# Patient Record
Sex: Male | Born: 1990 | Race: Black or African American | Hispanic: No | Marital: Single | Smoking: Current every day smoker
Health system: Southern US, Community
[De-identification: ages and names within clinical notes are randomized; demographics above are authoritative.]

---
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
## 2010-12-29 ENCOUNTER — Inpatient Hospital Stay (INDEPENDENT_AMBULATORY_CARE_PROVIDER_SITE_OTHER)
Admission: RE | Admit: 2010-12-29 | Discharge: 2010-12-29 | Disposition: A | Discharge status: Home / Self Care | Payer: Self-pay | Source: Ambulatory Visit | Attending: Family Medicine | Admitting: Family Medicine

## 2010-12-29 DIAGNOSIS — B9789 Other viral agents as the cause of diseases classified elsewhere: Secondary | ICD-10-CM

## 2010-12-29 DIAGNOSIS — K5289 Other specified noninfective gastroenteritis and colitis: Secondary | ICD-10-CM

## 2012-09-11 ENCOUNTER — Emergency Department (INDEPENDENT_AMBULATORY_CARE_PROVIDER_SITE_OTHER)
Admission: EM | Admit: 2012-09-11 | Discharge: 2012-09-11 | Disposition: A | Discharge status: Home / Self Care | Payer: Self-pay | Source: Home / Self Care | Attending: Emergency Medicine | Admitting: Emergency Medicine

## 2012-09-11 ENCOUNTER — Encounter (HOSPITAL_COMMUNITY): Payer: Self-pay | Admitting: Emergency Medicine

## 2012-09-11 DIAGNOSIS — K529 Noninfective gastroenteritis and colitis, unspecified: Secondary | ICD-10-CM

## 2012-09-11 DIAGNOSIS — K5289 Other specified noninfective gastroenteritis and colitis: Secondary | ICD-10-CM

## 2012-09-11 MED ORDER — DIPHENOXYLATE-ATROPINE 2.5-0.025 MG PO TABS: 1.0000 | ORAL_TABLET | Freq: Four times a day (QID) | ORAL | Status: DC | PRN

## 2012-09-11 MED ORDER — ONDANSETRON 8 MG PO TBDP: 8.0000 mg | ORAL_TABLET | Freq: Three times a day (TID) | ORAL | Status: DC | PRN

## 2012-09-11 NOTE — ED Provider Notes (Signed)
Chief Complaint  Patient presents with  . Nausea  . Diarrhea  . Emesis    History of Present Illness:   The patient is a 21 year old male who has had a five-day history of nausea, vomiting, and diarrhea. This came on after eating some [place] food. Also his sister was sick with a similar illness. He denies any associated fever or chills. He has had mild periumbilical abdominal pain. There's been no hematemesis, coffee-ground emesis, or bilious emesis. No blood in the stool. He states his symptoms have just about completely cleared up today and is just here to get a note to get back to work.  Review of Systems:  Other than noted above, the patient denies any of the following symptoms: Systemic:  No fevers, chills, sweats, weight loss or gain, fatigue, or tiredness. ENT:  No nasal congestion, rhinorrhea, or sore throat. Lungs:  No cough, wheezing, or shortness of breath. Cardiac:  No chest pain, syncope, or presyncope. GI:  No abdominal pain, nausea, vomiting, anorexia, diarrhea, constipation, blood in stool or vomitus. GU:  No dysuria, frequency, or urgency.  PMFSH:  Past medical history, family history, social history, meds, and allergies were reviewed.  Physical Exam:   Vital signs:  BP 114/72  Pulse 64  Temp 98.7 F (37.1 C) (Oral)  Resp 18  SpO2 98% General:  Alert and oriented.  In no distress.  Skin warm and dry.  Good skin turgor, brisk capillary refill. ENT:  No scleral icterus, moist mucous membranes, no oral lesions, pharynx clear. Lungs:  Breath sounds clear and equal bilaterally.  No wheezes, rales, or rhonchi. Heart:  Rhythm regular, without extrasystoles.  No gallops or murmers. Abdomen:  He has mild tenderness to palpation in the periumbilical area but no guarding or rebound. There is no organomegaly or mass. Bowel sounds are normally active. Skin: Clear, warm, and dry.  Good turgor.  Brisk capillary refill.  Assessment:  The encounter diagnosis was  Gastroenteritis.  Plan:   1.  The following meds were prescribed:   New Prescriptions   DIPHENOXYLATE-ATROPINE (LOMOTIL) 2.5-0.025 MG PER TABLET    Take 1 tablet by mouth 4 (four) times daily as needed for diarrhea or loose stools.   ONDANSETRON (ZOFRAN ODT) 8 MG DISINTEGRATING TABLET    Take 1 tablet (8 mg total) by mouth every 8 (eight) hours as needed for nausea.   2.  The patient was instructed in symptomatic care and handouts were given. 3.  The patient was told to return if becoming worse in any way, if no better in 2 or 3 days, and given some red flag symptoms that would indicate earlier return. 4.  The patient was told to take only sips of clear liquids for the next 24 hours and then advance to a b.r.a.t. Diet.      Reuben Likes, MD 09/11/12 1350

## 2012-09-11 NOTE — ED Notes (Signed)
Reports feeling sick since 09/07/12.  States he has nausea, vomiting, and diarrhea.  No medication taken.

## 2013-11-29 ENCOUNTER — Encounter (HOSPITAL_COMMUNITY): Payer: Self-pay | Admitting: Emergency Medicine

## 2013-11-29 ENCOUNTER — Emergency Department (HOSPITAL_COMMUNITY)
Admission: EM | Admit: 2013-11-29 | Discharge: 2013-11-30 | Disposition: A | Discharge status: Home / Self Care | Payer: Self-pay | Attending: Emergency Medicine | Admitting: Emergency Medicine

## 2013-11-29 DIAGNOSIS — F17200 Nicotine dependence, unspecified, uncomplicated: Secondary | ICD-10-CM | POA: Insufficient documentation

## 2013-11-29 DIAGNOSIS — J020 Streptococcal pharyngitis: Secondary | ICD-10-CM | POA: Insufficient documentation

## 2013-11-29 DIAGNOSIS — R509 Fever, unspecified: Secondary | ICD-10-CM | POA: Insufficient documentation

## 2013-11-29 DIAGNOSIS — R000 Tachycardia, unspecified: Secondary | ICD-10-CM | POA: Insufficient documentation

## 2013-11-29 NOTE — ED Notes (Signed)
Patient said he started feeling achy, sore throat, headache and having chills.  He denies cough or any other symptoms.  He also said he cannot swallow anything.

## 2013-11-29 NOTE — ED Provider Notes (Signed)
CSN: [id number]     Arrival date & time 11/29/13  2318 History  This chart was scribed for non-physician practitioner Emilia BeckKaitlyn Teller Wakefield, PA-C working with Layla MawKristen N Ward, DO by Joaquin MusicKristina Sanchez-Matthews, ED Scribe. This patient was seen in room TR09C/TR09C and the patient's care was started at 11:59 PM .   Chief Complaint  Patient presents with  . Sore Throat   The history is provided by the patient. No language interpreter was used.   HPI Comments: Martin Elliott is a 23 y.o. male who presents to the Emergency Department complaining of ongoing worsening sore throat that began this morning. Pt states he has taken OTC Dayquil with short-lived relief. He reports having pain when he swallows. Pt denies any recent sick contacts.   History reviewed. No pertinent past medical history. History reviewed. No pertinent past surgical history. History reviewed. No pertinent family history. History  Substance Use Topics  . Smoking status: Current Every Day Smoker -- 1.00 packs/day    Types: Cigarettes  . Smokeless tobacco: Never Used  . Alcohol Use: Yes    Review of Systems  All other systems reviewed and are negative.      Allergies  Review of patient's allergies indicates no known allergies.  Home Medications   Current Outpatient Rx  Name  Route  Sig  Dispense  Refill  . diphenoxylate-atropine (LOMOTIL) 2.5-0.025 MG per tablet   Oral   Take 1 tablet by mouth 4 (four) times daily as needed for diarrhea or loose stools.   16 tablet   0   . ondansetron (ZOFRAN ODT) 8 MG disintegrating tablet   Oral   Take 1 tablet (8 mg total) by mouth every 8 (eight) hours as needed for nausea.   20 tablet   0    BP 132/88  Pulse 111  Temp(Src) 100.9 F (38.3 C) (Oral)  SpO2 100%  Physical Exam  Nursing note and vitals reviewed. Constitutional: He is oriented to person, place, and time. He appears well-developed and well-nourished. No distress.  HENT:  Head: Normocephalic and atraumatic.   Petechiae and erythema noted in posterior pharynx.   Eyes: EOM are normal.  Neck: Neck supple. No tracheal deviation present.  Cardiovascular: Normal rate.   Pulmonary/Chest: Effort normal. No respiratory distress.  Musculoskeletal: Normal range of motion.  Neurological: He is alert and oriented to person, place, and time.  Skin: Skin is warm and dry.  Psychiatric: He has a normal mood and affect. His behavior is normal.    ED Course  Procedures DIAGNOSTIC STUDIES: Oxygen Saturation is 100% on RA, normal by my interpretation.    COORDINATION OF CARE: 12:02 AM-Discussed treatment plan which includes give penicillin shot to patient while in ED. Pt agreed to plan.   Labs Review Labs Reviewed - No data to display Imaging Review No results found.   EKG Interpretation None      MDM   Final diagnoses:  Strep throat    1:03 AM Patient has strep throat and will be treated with IM bicillin and 1g tylenol. Patient is tachycardic likely due to fever. Patient advised to return with worsening or concerning symptoms.   I personally performed the services described in this documentation, which was scribed in my presence. The recorded information has been reviewed and is accurate.    Emilia BeckKaitlyn Kaila Devries, PA-C 11/30/13 0104

## 2013-11-30 MED ORDER — PENICILLIN G BENZATHINE 1200000 UNIT/2ML IM SUSP
1.2000 10*6.[IU] | Freq: Once | INTRAMUSCULAR | Status: AC
  Administered 2013-11-30: 1.2 10*6.[IU] via INTRAMUSCULAR
  Filled 2013-11-30: qty 2

## 2013-11-30 MED ORDER — ACETAMINOPHEN 500 MG PO TABS
1000.0000 mg | ORAL_TABLET | Freq: Once | ORAL | Status: AC
  Administered 2013-11-30: 1000 mg via ORAL
  Filled 2013-11-30: qty 2

## 2013-11-30 NOTE — Discharge Instructions (Signed)
Drink plenty of fluids and rest. Refer to attached documents for more information.

## 2013-11-30 NOTE — ED Provider Notes (Signed)
Medical screening examination/treatment/procedure(s) were performed by non-physician practitioner and as supervising physician I was immediately available for consultation/collaboration.   EKG Interpretation None        Issiac Jamar N Justa Hatchell, DO 11/30/13 0107 

## 2013-11-30 NOTE — ED Notes (Signed)
Patient is alert and orientedx4.  Patient was explained discharge instructions and they understood them with no questions.   

## 2014-03-11 ENCOUNTER — Emergency Department (HOSPITAL_COMMUNITY)
Admission: EM | Admit: 2014-03-11 | Discharge: 2014-03-11 | Disposition: A | Discharge status: Home / Self Care | Payer: Self-pay | Attending: Emergency Medicine | Admitting: Emergency Medicine

## 2014-03-11 ENCOUNTER — Emergency Department (HOSPITAL_COMMUNITY): Payer: Self-pay

## 2014-03-11 ENCOUNTER — Encounter (HOSPITAL_COMMUNITY): Payer: Self-pay | Admitting: Emergency Medicine

## 2014-03-11 DIAGNOSIS — S161XXA Strain of muscle, fascia and tendon at neck level, initial encounter: Secondary | ICD-10-CM

## 2014-03-11 DIAGNOSIS — Y92410 Unspecified street and highway as the place of occurrence of the external cause: Secondary | ICD-10-CM | POA: Insufficient documentation

## 2014-03-11 DIAGNOSIS — IMO0002 Reserved for concepts with insufficient information to code with codable children: Secondary | ICD-10-CM | POA: Insufficient documentation

## 2014-03-11 DIAGNOSIS — Y9389 Activity, other specified: Secondary | ICD-10-CM | POA: Insufficient documentation

## 2014-03-11 DIAGNOSIS — Z791 Long term (current) use of non-steroidal anti-inflammatories (NSAID): Secondary | ICD-10-CM | POA: Insufficient documentation

## 2014-03-11 DIAGNOSIS — F17200 Nicotine dependence, unspecified, uncomplicated: Secondary | ICD-10-CM | POA: Insufficient documentation

## 2014-03-11 DIAGNOSIS — S139XXA Sprain of joints and ligaments of unspecified parts of neck, initial encounter: Secondary | ICD-10-CM | POA: Insufficient documentation

## 2014-03-11 MED ORDER — OXYCODONE-ACETAMINOPHEN 5-325 MG PO TABS
1.0000 | ORAL_TABLET | Freq: Once | ORAL | Status: AC
  Administered 2014-03-11: 1 via ORAL
  Filled 2014-03-11: qty 1

## 2014-03-11 MED ORDER — METHOCARBAMOL 500 MG PO TABS
500.0000 mg | ORAL_TABLET | Freq: Two times a day (BID) | ORAL | Status: DC
Start: 2014-03-11 — End: 2018-07-13

## 2014-03-11 MED ORDER — IBUPROFEN 800 MG PO TABS: 800.0000 mg | ORAL_TABLET | Freq: Three times a day (TID) | ORAL | Status: DC
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
## 2014-03-11 NOTE — ED Notes (Signed)
Declined W/C at D/C and was escorted to lobby by RN. 

## 2014-03-11 NOTE — Discharge Instructions (Signed)
1. Medications: robaxin, ibuprofen, usual home medications 2. Treatment: rest, drink plenty of fluids, gentle stretching as discussed, alternate ice and heat 3. Follow Up: Please followup with your primary doctor for discussion of your diagnoses and further evaluation after today's visit; if you do not have a primary care doctor use the resource guide provided to find one;    Cervical Sprain A cervical sprain is an injury in the neck in which the strong, fibrous tissues (ligaments) that connect your neck bones stretch or tear. Cervical sprains can range from mild to severe. Severe cervical sprains can cause the neck vertebrae to be unstable. This can lead to damage of the spinal cord and can result in serious nervous system problems. The amount of time it takes for a cervical sprain to get better depends on the cause and extent of the injury. Most cervical sprains heal in 1 to 3 weeks. CAUSES  Severe cervical sprains may be caused by:   Contact sport injuries (such as from football, rugby, wrestling, hockey, auto racing, gymnastics, diving, martial arts, or boxing).   Motor vehicle collisions.   Whiplash injuries. This is an injury from a sudden forward-and backward whipping movement of the head and neck.  Falls.  Mild cervical sprains may be caused by:   Being in an awkward position, such as while cradling a telephone between your ear and shoulder.   Sitting in a chair that does not offer proper support.   Working at a poorly Marketing executivedesigned computer station.   Looking up or down for long periods of time.  SYMPTOMS   Pain, soreness, stiffness, or a burning sensation in the front, back, or sides of the neck. This discomfort may develop immediately after the injury or slowly, 24 hours or more after the injury.   Pain or tenderness directly in the middle of the back of the neck.   Shoulder or upper back pain.   Limited ability to move the neck.   Headache.   Dizziness.    Weakness, numbness, or tingling in the hands or arms.   Muscle spasms.   Difficulty swallowing or chewing.   Tenderness and swelling of the neck.  DIAGNOSIS  Most of the time your health care provider can diagnose a cervical sprain by taking your history and doing a physical exam. Your health care provider will ask about previous neck injuries and any known neck problems, such as arthritis in the neck. X-rays may be taken to find out if there are any other problems, such as with the bones of the neck. Other tests, such as a CT scan or MRI, may also be needed.  TREATMENT  Treatment depends on the severity of the cervical sprain. Mild sprains can be treated with rest, keeping the neck in place (immobilization), and pain medicines. Severe cervical sprains are immediately immobilized. Further treatment is done to help with pain, muscle spasms, and other symptoms and may include:  Medicines, such as pain relievers, numbing medicines, or muscle relaxants.   Physical therapy. This may involve stretching exercises, strengthening exercises, and posture training. Exercises and improved posture can help stabilize the neck, strengthen muscles, and help stop symptoms from returning.  HOME CARE INSTRUCTIONS   Put ice on the injured area.   Put ice in a plastic bag.   Place a towel between your skin and the bag.   Leave the ice on for 15 20 minutes, 3 4 times a day.   If your injury was severe, you may have  been given a cervical collar to wear. A cervical collar is a two-piece collar designed to keep your neck from moving while it heals.  Do not remove the collar unless instructed by your health care provider.  If you have long hair, keep it outside of the collar.  Ask your health care provider before making any adjustments to your collar. Minor adjustments may be required over time to improve comfort and reduce pressure on your chin or on the back of your head.  Ifyou are allowed  to remove the collar for cleaning or bathing, follow your health care provider's instructions on how to do so safely.  Keep your collar clean by wiping it with mild soap and water and drying it completely. If the collar you have been given includes removable pads, remove them every 1 2 days and hand wash them with soap and water. Allow them to air dry. They should be completely dry before you wear them in the collar.  If you are allowed to remove the collar for cleaning and bathing, wash and dry the skin of your neck. Check your skin for irritation or sores. If you see any, tell your health care provider.  Do not drive while wearing the collar.   Only take over-the-counter or prescription medicines for pain, discomfort, or fever as directed by your health care provider.   Keep all follow-up appointments as directed by your health care provider.   Keep all physical therapy appointments as directed by your health care provider.   Make any needed adjustments to your workstation to promote good posture.   Avoid positions and activities that make your symptoms worse.   Warm up and stretch before being active to help prevent problems.  SEEK MEDICAL CARE IF:   Your pain is not controlled with medicine.   You are unable to decrease your pain medicine over time as planned.   Your activity level is not improving as expected.  SEEK IMMEDIATE MEDICAL CARE IF:   You develop any bleeding.  You develop stomach upset.  You have signs of an allergic reaction to your medicine.   Your symptoms get worse.   You develop new, unexplained symptoms.   You have numbness, tingling, weakness, or paralysis in any part of your body.  MAKE SURE YOU:   Understand these instructions.  Will watch your condition.  Will get help right away if you are not doing well or get worse. Document Released: 07/11/2007 Document Revised: 07/04/2013 Document Reviewed: 03/21/2013 [facility] Patient  Information 2014 [employer], [employer].
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
## 2014-03-12 NOTE — ED Provider Notes (Signed)
Medical screening examination/treatment/procedure(s) were performed by non-physician practitioner and as supervising physician I was immediately available for consultation/collaboration.   EKG Interpretation None       Flint MelterElliott L Orbin Mayeux, MD 03/12/14 1246

## 2015-10-12 IMAGING — CR DG CERVICAL SPINE COMPLETE 4+V
5 series · 5 of 5 positions shown · non-contrast
Comparison: None.

CLINICAL DATA: Lower cervical pain.  No known injury.

EXAM:
CERVICAL SPINE  4+ VIEWS

[w c-spine lat]
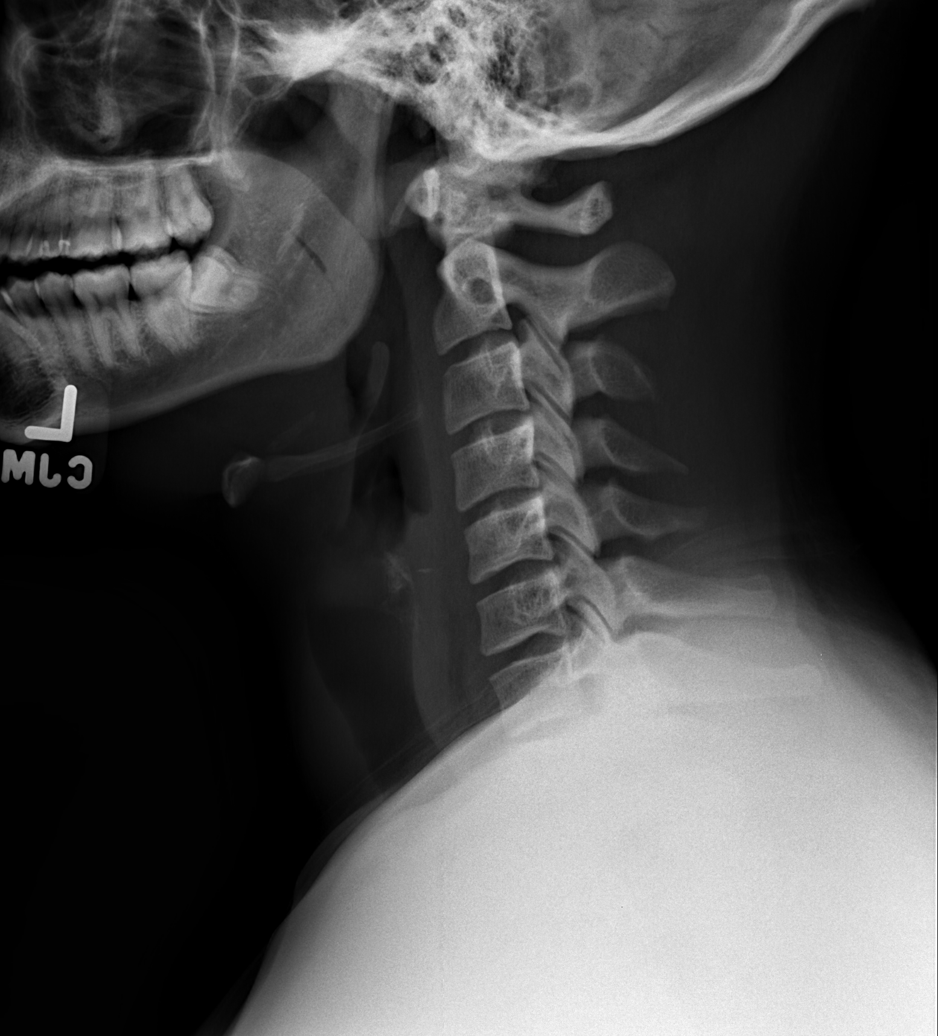

[w c-spine oblique (1 of 2)]
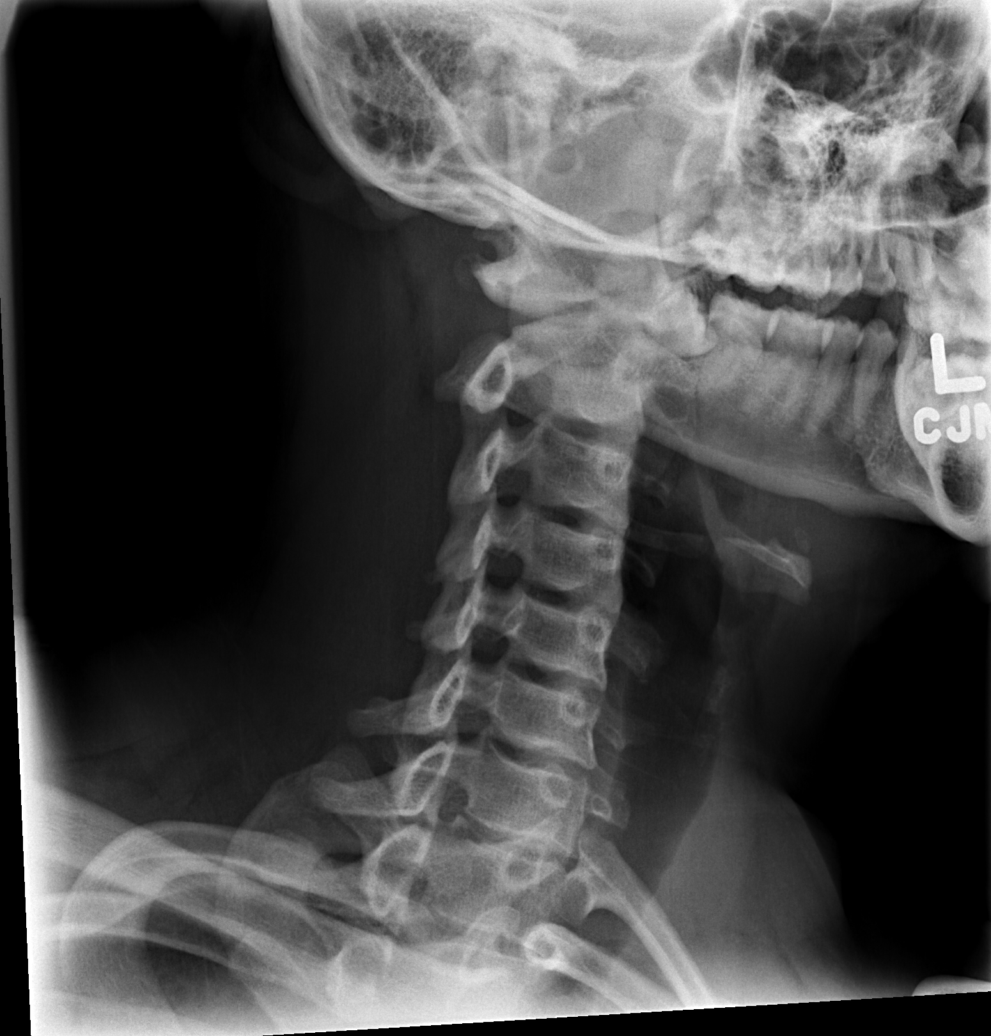

[w c-spine oblique (2 of 2)]
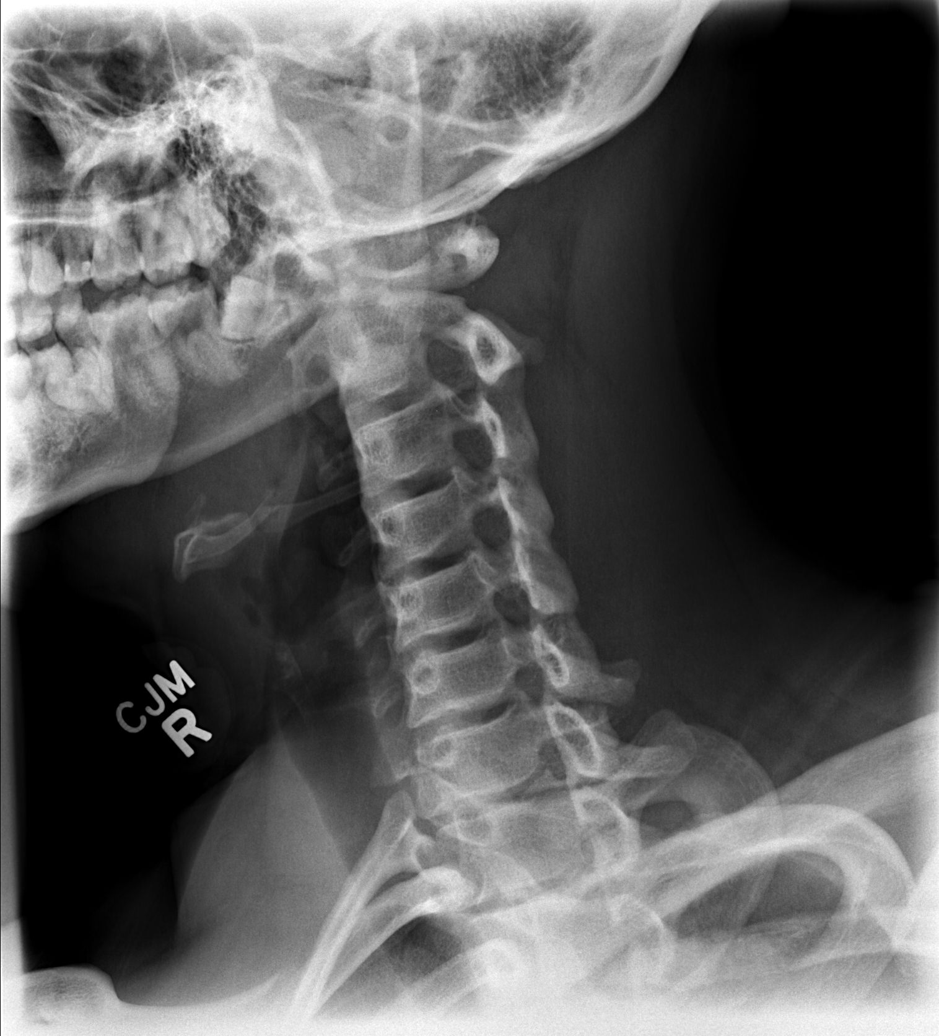

[w c-spine a.p.]
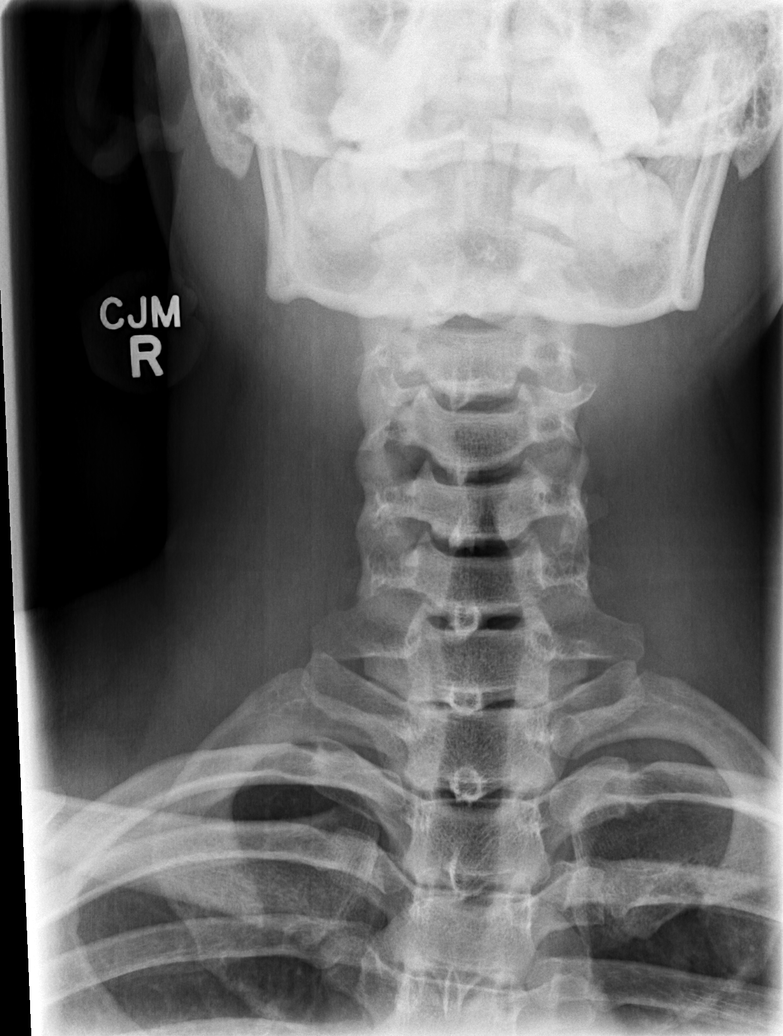

[w c-spine odontoid]
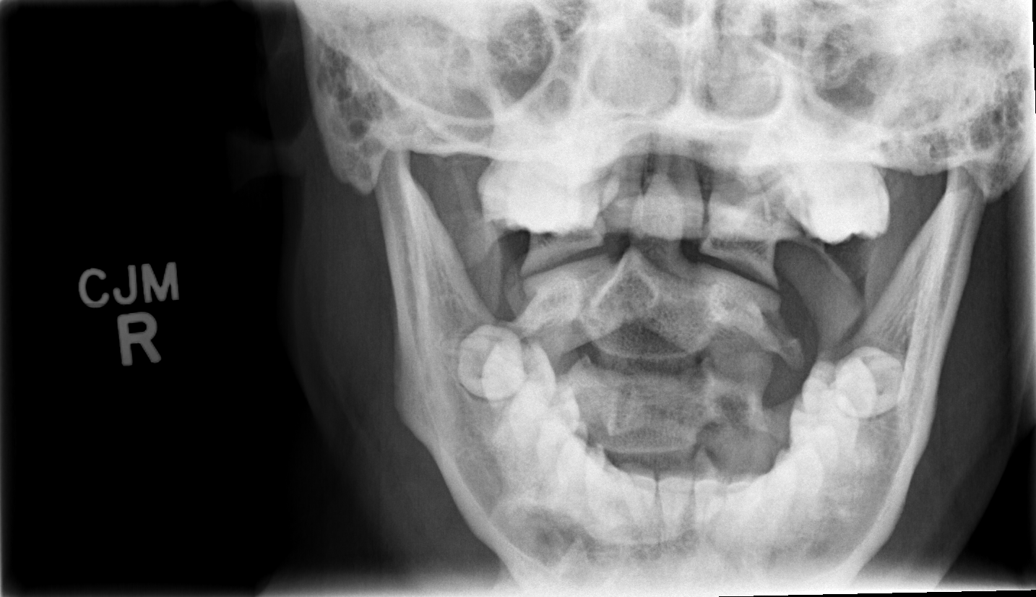

[5 of 5 positions shown; findings below may reference images not displayed]

FINDINGS: There is no evidence of cervical spine fracture or prevertebral soft
tissue swelling. Alignment is normal. No other significant bone
abnormalities are identified.
IMPRESSION: Negative cervical spine radiographs.

## 2018-07-13 ENCOUNTER — Encounter (HOSPITAL_COMMUNITY): Payer: Self-pay | Admitting: Emergency Medicine

## 2018-07-13 ENCOUNTER — Ambulatory Visit (HOSPITAL_COMMUNITY)
Admission: EM | Admit: 2018-07-13 | Discharge: 2018-07-13 | Disposition: A | Discharge status: Home / Self Care | Payer: Self-pay | Attending: Family Medicine | Admitting: Family Medicine

## 2018-07-13 DIAGNOSIS — R69 Illness, unspecified: Secondary | ICD-10-CM

## 2018-07-13 DIAGNOSIS — J111 Influenza due to unidentified influenza virus with other respiratory manifestations: Secondary | ICD-10-CM | POA: Insufficient documentation

## 2018-07-13 DIAGNOSIS — F17210 Nicotine dependence, cigarettes, uncomplicated: Secondary | ICD-10-CM | POA: Insufficient documentation

## 2018-07-13 LAB — POCT RAPID STREP A: Streptococcus, Group A Screen (Direct): NEGATIVE

## 2018-07-13 MED ORDER — IBUPROFEN 800 MG PO TABS
ORAL_TABLET | ORAL | Status: AC
  Filled 2018-07-13: qty 1

## 2018-07-13 MED ORDER — IBUPROFEN 800 MG PO TABS: 800.0000 mg | ORAL_TABLET | Freq: Three times a day (TID) | ORAL | 0 refills | Status: AC

## 2018-07-13 MED ORDER — IBUPROFEN 800 MG PO TABS
800.0000 mg | ORAL_TABLET | Freq: Once | ORAL | Status: AC
  Administered 2018-07-13: 800 mg via ORAL

## 2018-07-13 NOTE — Discharge Instructions (Signed)
Push fluids to ensure adequate hydration and keep secretions thin.  °Tylenol and/or ibuprofen as needed for pain or fevers.   °Rest °Diet as tolerated.  °If symptoms worsen or do not improve in the next week to return to be seen or to follow up with your PCP.   °

## 2018-07-13 NOTE — ED Triage Notes (Signed)
Pt c/o body aches, sore throat, flu like symptoms since yesterday.

## 2018-07-13 NOTE — ED Provider Notes (Signed)
[facility]    CSN: [id number] Arrival date & time: 07/13/18  1239     History   Chief Complaint Chief Complaint  Patient presents with  . Generalized Body Aches    HPI Martin Elliott is a 27 y.o. male.   [name] presents with complaints of sore throat, body aches, chills, which started yesterday morning. No gi/gu complaints. Decreased appetite. Still drinking fluids. Normal urination. No cough or ear pain. No known ill contacts. No rash. Headache earlier today which has improved. Back ache. Has taken theraflu which didn't seem to help. No other medications for symptoms. Without contributing medical history.     ROS per HPI.      History reviewed. No pertinent past medical history.  There are no active problems to display for this patient.   History reviewed. No pertinent surgical history.     Home Medications    Prior to Admission medications   Medication Sig Start Date End Date Taking? Authorizing Provider  ibuprofen (ADVIL,MOTRIN) 800 MG tablet Take 1 tablet (800 mg total) by mouth 3 (three) times daily. 07/13/18   Georgetta Haber, NP    Family History No family history on file.  Social History Social History   Tobacco Use  . Smoking status: Current Every Day Smoker    Packs/day: 1.00    Types: Cigarettes  . Smokeless tobacco: Never Used  Substance Use Topics  . Alcohol use: Yes  . Drug use: Yes    Types: Marijuana     Allergies   Patient has no known allergies.   Review of Systems Review of Systems   Physical Exam Triage Vital Signs ED Triage Vitals  Enc Vitals Group     BP 07/13/18 1316 (!) 138/112     Pulse Rate 07/13/18 1313 99     Resp 07/13/18 1313 16     Temp 07/13/18 1313 98.5 F (36.9 C)     Temp src --      SpO2 07/13/18 1313 99 %     Weight --      Height --      Head Circumference --      Peak Flow --      Pain Score 07/13/18 1314 10     Pain Loc --      Pain Edu? --      Excl. in GC? --    No data  found.  Updated Vital Signs BP (!) 138/112   Pulse 99   Temp 98.5 F (36.9 C)   Resp 16   SpO2 99%    Physical Exam  Constitutional: He is oriented to person, place, and time. He appears well-developed and well-nourished.  HENT:  Head: Normocephalic and atraumatic.  Right Ear: Tympanic membrane, external ear and ear canal normal.  Left Ear: Tympanic membrane, external ear and ear canal normal.  Nose: Nose normal. Right sinus exhibits no maxillary sinus tenderness and no frontal sinus tenderness. Left sinus exhibits no maxillary sinus tenderness and no frontal sinus tenderness.  Mouth/Throat: Uvula is midline and mucous membranes are normal. Posterior oropharyngeal erythema present. No tonsillar abscesses. Tonsils are 2+ on the right. No tonsillar exudate.  Eyes: Pupils are equal, round, and reactive to light. Conjunctivae are normal.  Neck: Normal range of motion.  Cardiovascular: Normal rate and regular rhythm.  Pulmonary/Chest: Effort normal and breath sounds normal.  Lymphadenopathy:    He has no cervical adenopathy.  Neurological: He is alert and oriented to person, place, and  time.  Skin: Skin is warm and dry.  Vitals reviewed.    UC Treatments / Results  Labs (all labs ordered are listed, but only abnormal results are displayed) Labs Reviewed  POCT RAPID STREP A    EKG None  Radiology No results found.  Procedures Procedures (including critical care time)  Medications Ordered in UC Medications  ibuprofen (ADVIL,MOTRIN) tablet 800 mg (has no administration in time range)    Initial Impression / Assessment and Plan / UC Course  I have reviewed the triage vital signs and the nursing notes.  Pertinent labs & imaging results that were available during my care of the patient were reviewed by me and considered in my medical decision making (see chart for details).    Afebrile. Non toxic in appearance but does appear uncomfortable. Negative rapid strep. History  and physical consistent with viral illness.  Supportive cares recommended. Return precautions provided. If symptoms worsen or do not improve in the next week to return to be seen or to follow up with PCP.  Patient verbalized understanding and agreeable to plan.   Final Clinical Impressions(s) / UC Diagnoses   Final diagnoses:  Influenza-like illness     Discharge Instructions     Push fluids to ensure adequate hydration and keep secretions thin.  Tylenol and/or ibuprofen as needed for pain or fevers.  Rest.  Diet as tolerated.  If symptoms worsen or do not improve in the next week to return to be seen or to follow up with your PCP.     ED Prescriptions    Medication Sig Dispense Auth. Provider   ibuprofen (ADVIL,MOTRIN) 800 MG tablet Take 1 tablet (800 mg total) by mouth 3 (three) times daily. 21 tablet Georgetta Haber, NP     Controlled Substance Prescriptions [place] Controlled Substance Registry consulted? Not Applicable   Georgetta Haber, NP 07/13/18 1409

## 2018-07-15 LAB — CULTURE, GROUP A STREP (THRC)

## 2019-07-04 ENCOUNTER — Other Ambulatory Visit: Payer: Self-pay

## 2019-07-04 ENCOUNTER — Encounter (HOSPITAL_COMMUNITY): Payer: Self-pay

## 2019-07-04 ENCOUNTER — Ambulatory Visit (HOSPITAL_COMMUNITY)
Admission: EM | Admit: 2019-07-04 | Discharge: 2019-07-04 | Disposition: A | Discharge status: Home / Self Care | Payer: Self-pay | Attending: Urgent Care | Admitting: Urgent Care

## 2019-07-04 DIAGNOSIS — Z7251 High risk heterosexual behavior: Secondary | ICD-10-CM

## 2019-07-04 DIAGNOSIS — Z202 Contact with and (suspected) exposure to infections with a predominantly sexual mode of transmission: Secondary | ICD-10-CM

## 2019-07-04 DIAGNOSIS — Z113 Encounter for screening for infections with a predominantly sexual mode of transmission: Secondary | ICD-10-CM

## 2019-07-04 NOTE — ED Triage Notes (Signed)
Pt presents to UC stating he would like to have STD testing. Pt states he has had a sexual partner positive for trich

## 2019-07-04 NOTE — ED Provider Notes (Signed)
  MRN: [id number] DOB: [birth year]  Subjective:   Martin Elliott is a 28 y.o. male presenting for possible exposure to trichomonas.  Patient states that he had sex with a male several months ago.  The same sex partner told the patient that she tested positive for trichomonas 2 weeks ago.  He would like to get tested today.  He is refusing empiric treatment. He is not currently taking any medications and has no known food or drug allergies.  Denies past medical and surgical history.   ROS Denies dysuria, hematuria, urinary frequency, penile discharge, penile swelling, testicular pain, testicular swelling, anal pain, groin pain.  Objective:   Vitals: BP 119/72 (BP Location: Right Arm)   Pulse 80   Temp 98.4 F (36.9 C) (Oral)   Resp 16   SpO2 98%   Physical Exam Constitutional:      Appearance: Normal appearance. He is well-developed and normal weight.  HENT:     Head: Normocephalic and atraumatic.     Right Ear: External ear normal.     Left Ear: External ear normal.     Nose: Nose normal.     Mouth/Throat:     Pharynx: Oropharynx is clear.  Eyes:     Extraocular Movements: Extraocular movements intact.     Pupils: Pupils are equal, round, and reactive to light.  Cardiovascular:     Rate and Rhythm: Normal rate.  Pulmonary:     Effort: Pulmonary effort is normal.  Genitourinary:    Penis: No phimosis, paraphimosis, hypospadias, erythema, tenderness, discharge, swelling or lesions.   Neurological:     Mental Status: He is alert and oriented to person, place, and time.  Psychiatric:        Mood and Affect: Mood normal.        Behavior: Behavior normal.     Assessment and Plan :   1. Exposure to STD   2. Unprotected sex     At patient's request, we will hold off on empiric treatment and base this off of lab results. Counseled on risks of untreated STI's, anticipatory guidance provided.   Jaynee Eagles, PA-C 07/04/19 1404

## 2019-07-06 LAB — CYTOLOGY, (ORAL, ANAL, URETHRAL) ANCILLARY ONLY
Chlamydia: NEGATIVE
Neisseria Gonorrhea: NEGATIVE
Trichomonas: NEGATIVE

## 2020-08-22 ENCOUNTER — Encounter (HOSPITAL_COMMUNITY): Payer: Self-pay

## 2020-08-22 ENCOUNTER — Ambulatory Visit (HOSPITAL_COMMUNITY)
Admission: EM | Admit: 2020-08-22 | Discharge: 2020-08-22 | Disposition: A | Discharge status: Home / Self Care | Payer: HRSA Program | Attending: Family Medicine | Admitting: Family Medicine

## 2020-08-22 ENCOUNTER — Other Ambulatory Visit: Payer: Self-pay

## 2020-08-22 DIAGNOSIS — R519 Headache, unspecified: Secondary | ICD-10-CM | POA: Diagnosis not present

## 2020-08-22 DIAGNOSIS — Z20822 Contact with and (suspected) exposure to covid-19: Secondary | ICD-10-CM | POA: Insufficient documentation

## 2020-08-22 LAB — SARS CORONAVIRUS 2 (TAT 6-24 HRS): SARS Coronavirus 2: NEGATIVE

## 2020-08-22 NOTE — ED Triage Notes (Signed)
Patient states he has had a headache this morning with no other symptoms and was around someone that told him they may have had covid. Pt just wants checked. Pt also states he needs a doctors note for work.

## 2020-08-22 NOTE — Discharge Instructions (Signed)
Go home to rest Drink plenty of fluids Take Tylenol or ibuprofen for pain or fever You may take over-the-counter cough and cold medicines as needed You must quarantine at home until your test result is available You can check for your test result in MyChart CALL FOR QUESTIONS.  If your Covid test is positive it is important to stay home and quarantine  

## 2020-08-22 NOTE — ED Provider Notes (Signed)
[facility]    CSN: [id number] Arrival date & time: 08/22/20  1158      History   Chief Complaint Chief Complaint  Patient presents with  . Headache    since this morning    HPI Martin Elliott is a 29 y.o. male.   HPI Patient woke up with a headache this morning.  He is concerned because he was exposed to Covid.  He needs a note for work.  He is requesting Covid testing.  He has no other symptoms.  No fever chills body aches headaches or loss of taste or smell.  No cough or shortness of breath History reviewed. No pertinent past medical history.  There are no problems to display for this patient.   History reviewed. No pertinent surgical history.     Home Medications    Prior to Admission medications   Medication Sig Start Date End Date Taking? Authorizing Provider  ibuprofen (ADVIL,MOTRIN) 800 MG tablet Take 1 tablet (800 mg total) by mouth 3 (three) times daily. 07/13/18   Georgetta Haber, NP    Family History History reviewed. No pertinent family history.  Social History Social History   Tobacco Use  . Smoking status: Current Every Day Smoker    Packs/day: 1.00    Types: Cigarettes  . Smokeless tobacco: Never Used  Vaping Use  . Vaping Use: Never used  Substance Use Topics  . Alcohol use: Yes  . Drug use: Yes    Types: Marijuana     Allergies   Patient has no known allergies.   Review of Systems Review of Systems  See HPI Physical Exam Triage Vital Signs ED Triage Vitals  Enc Vitals Group     BP 08/22/20 1336 115/62     Pulse Rate 08/22/20 1336 66     Resp 08/22/20 1336 18     Temp 08/22/20 1336 98 F (36.7 C)     Temp Source 08/22/20 1336 Oral     SpO2 08/22/20 1336 100 %     Weight --      Height --      Head Circumference --      Peak Flow --      Pain Score 08/22/20 1338 0     Pain Loc --      Pain Edu? --      Excl. in GC? --    No data found.  Updated Vital Signs BP 115/62 (BP Location: Right Arm)    Pulse 66   Temp 98 F (36.7 C) (Oral)   Resp 18   SpO2 100%     Physical Exam Constitutional:      General: He is not in acute distress.    Appearance: He is well-developed.     Comments: Appears well.  Mask is in place  HENT:     Head: Normocephalic and atraumatic.  Eyes:     Conjunctiva/sclera: Conjunctivae normal.     Pupils: Pupils are equal, round, and reactive to light.  Cardiovascular:     Rate and Rhythm: Normal rate.  Pulmonary:     Effort: Pulmonary effort is normal. No respiratory distress.     Comments: Lungs are clear Abdominal:     General: There is no distension.     Palpations: Abdomen is soft.  Musculoskeletal:        General: Normal range of motion.     Cervical back: Normal range of motion.  Skin:    General: Skin is  warm and dry.  Neurological:     Mental Status: He is alert.  Psychiatric:        Mood and Affect: Mood normal.        Behavior: Behavior normal.      UC Treatments / Results  Labs (all labs ordered are listed, but only abnormal results are displayed) Labs Reviewed  SARS CORONAVIRUS 2 (TAT 6-24 HRS)    EKG   Radiology No results found.  Procedures Procedures (including critical care time)  Medications Ordered in UC Medications - No data to display  Initial Impression / Assessment and Plan / UC Course  I have reviewed the triage vital signs and the nursing notes.  Pertinent labs & imaging results that were available during my care of the patient were reviewed by me and considered in my medical decision making (see chart for details).      Final Clinical Impressions(s) / UC Diagnoses   Final diagnoses:  Bad headache  Exposure to COVID-19 virus     Discharge Instructions     Go home to rest Drink plenty of fluids Take Tylenol or ibuprofen for pain or fever You may take over-the-counter cough and cold medicines as needed You must quarantine at home until your test result is available You can check for your  test result in MyChart CALL FOR QUESTIONS.  If your Covid test is positive it is important to stay home and quarantine    ED Prescriptions    None     PDMP not reviewed this encounter.   Eustace Moore, MD 08/22/20 [phone]

## 2022-08-03 ENCOUNTER — Emergency Department (HOSPITAL_BASED_OUTPATIENT_CLINIC_OR_DEPARTMENT_OTHER): Payer: BC Managed Care – PPO | Admitting: Radiology

## 2022-08-03 ENCOUNTER — Encounter (HOSPITAL_BASED_OUTPATIENT_CLINIC_OR_DEPARTMENT_OTHER): Payer: Self-pay

## 2022-08-03 ENCOUNTER — Other Ambulatory Visit: Payer: Self-pay

## 2022-08-03 DIAGNOSIS — Z5321 Procedure and treatment not carried out due to patient leaving prior to being seen by health care provider: Secondary | ICD-10-CM | POA: Insufficient documentation

## 2022-08-03 DIAGNOSIS — R509 Fever, unspecified: Secondary | ICD-10-CM | POA: Insufficient documentation

## 2022-08-03 DIAGNOSIS — R197 Diarrhea, unspecified: Secondary | ICD-10-CM | POA: Diagnosis not present

## 2022-08-03 DIAGNOSIS — R059 Cough, unspecified: Secondary | ICD-10-CM | POA: Diagnosis not present

## 2022-08-03 DIAGNOSIS — J101 Influenza due to other identified influenza virus with other respiratory manifestations: Secondary | ICD-10-CM | POA: Diagnosis not present

## 2022-08-03 DIAGNOSIS — R0602 Shortness of breath: Secondary | ICD-10-CM | POA: Diagnosis not present

## 2022-08-03 DIAGNOSIS — R42 Dizziness and giddiness: Secondary | ICD-10-CM | POA: Diagnosis not present

## 2022-08-03 DIAGNOSIS — Z1152 Encounter for screening for COVID-19: Secondary | ICD-10-CM | POA: Diagnosis not present

## 2022-08-03 DIAGNOSIS — M791 Myalgia, unspecified site: Secondary | ICD-10-CM | POA: Insufficient documentation

## 2022-08-03 LAB — RESP PANEL BY RT-PCR (FLU A&B, COVID) ARPGX2
Influenza A by PCR: POSITIVE — AB
Influenza B by PCR: NEGATIVE
SARS Coronavirus 2 by RT PCR: NEGATIVE

## 2022-08-03 NOTE — ED Triage Notes (Signed)
Pt c/o "flu symptoms" onset last night/ this morning. Endorses fever, bodyaches, diarrhea, HA, dizziness, cough, "a little" SHOB. Endorses known sick contact who was diagnosed w flu yesterday. Endorses tylenol, symptom management.

## 2022-08-04 ENCOUNTER — Emergency Department (HOSPITAL_BASED_OUTPATIENT_CLINIC_OR_DEPARTMENT_OTHER)
Admission: EM | Admit: 2022-08-04 | Discharge: 2022-08-04 | Discharge status: Against Medical Advice | Payer: BC Managed Care – PPO | Attending: Emergency Medicine | Admitting: Emergency Medicine

## 2022-10-23 ENCOUNTER — Emergency Department (HOSPITAL_COMMUNITY)
Admission: EM | Admit: 2022-10-23 | Discharge: 2022-10-24 | Discharge status: Against Medical Advice | Payer: BC Managed Care – PPO | Attending: Emergency Medicine | Admitting: Emergency Medicine

## 2022-10-23 ENCOUNTER — Other Ambulatory Visit: Payer: Self-pay

## 2022-10-23 DIAGNOSIS — R350 Frequency of micturition: Secondary | ICD-10-CM | POA: Insufficient documentation

## 2022-10-23 DIAGNOSIS — R109 Unspecified abdominal pain: Secondary | ICD-10-CM | POA: Insufficient documentation

## 2022-10-23 DIAGNOSIS — Z5321 Procedure and treatment not carried out due to patient leaving prior to being seen by health care provider: Secondary | ICD-10-CM | POA: Insufficient documentation

## 2022-10-23 LAB — CBC WITH DIFFERENTIAL/PLATELET
Abs Immature Granulocytes: 0.01 10*3/uL (ref 0.00–0.07)
Basophils Absolute: 0 10*3/uL (ref 0.0–0.1)
Basophils Relative: 1 %
Eosinophils Absolute: 0.2 10*3/uL (ref 0.0–0.5)
Eosinophils Relative: 3 %
HCT: 46.8 % (ref 39.0–52.0)
Hemoglobin: 15.3 g/dL (ref 13.0–17.0)
Immature Granulocytes: 0 %
Lymphocytes Relative: 34 %
Lymphs Abs: 2 10*3/uL (ref 0.7–4.0)
MCH: 29.1 pg (ref 26.0–34.0)
MCHC: 32.7 g/dL (ref 30.0–36.0)
MCV: 89.1 fL (ref 80.0–100.0)
Monocytes Absolute: 0.6 10*3/uL (ref 0.1–1.0)
Monocytes Relative: 10 %
Neutro Abs: 3.1 10*3/uL (ref 1.7–7.7)
Neutrophils Relative %: 52 %
Platelets: 236 10*3/uL (ref 150–400)
RBC: 5.25 MIL/uL (ref 4.22–5.81)
RDW: 13.3 % (ref 11.5–15.5)
WBC: 5.8 10*3/uL (ref 4.0–10.5)
nRBC: 0 % (ref 0.0–0.2)

## 2022-10-23 LAB — COMPREHENSIVE METABOLIC PANEL
ALT: 30 U/L (ref 0–44)
AST: 26 U/L (ref 15–41)
Albumin: 4.3 g/dL (ref 3.5–5.0)
Alkaline Phosphatase: 104 U/L (ref 38–126)
Anion gap: 7 (ref 5–15)
BUN: 15 mg/dL (ref 6–20)
CO2: 28 mmol/L (ref 22–32)
Calcium: 9.4 mg/dL (ref 8.9–10.3)
Chloride: 108 mmol/L (ref 98–111)
Creatinine, Ser: 0.98 mg/dL (ref 0.61–1.24)
GFR, Estimated: >60 mL/min (ref >60)
Glucose, Bld: 97 mg/dL (ref 70–99)
Potassium: 3.9 mmol/L (ref 3.5–5.1)
Sodium: 143 mmol/L (ref 135–145)
Total Bilirubin: 0.6 mg/dL (ref 0.3–1.2)
Total Protein: 7.8 g/dL (ref 6.5–8.1)

## 2022-10-23 LAB — URINALYSIS, ROUTINE W REFLEX MICROSCOPIC
Bilirubin Urine: NEGATIVE
Glucose, UA: NEGATIVE mg/dL
Hgb urine dipstick: NEGATIVE
Ketones, ur: NEGATIVE mg/dL
Leukocytes,Ua: NEGATIVE
Nitrite: NEGATIVE
Protein, ur: NEGATIVE mg/dL
Specific Gravity, Urine: 1.01 (ref 1.005–1.030)
pH: 7 (ref 5.0–8.0)

## 2022-10-23 NOTE — ED Triage Notes (Signed)
Pt reports left side flank pain on and off for the last week. Pt reports urinary frequency as well. Pt denies other complaints.

## 2022-10-23 NOTE — ED Provider Triage Note (Signed)
Emergency Medicine Provider Triage Evaluation Note  Martin Elliott , a 32 y.o. male  was evaluated in triage.  Pt complains of left flank pain, at the inferior margin of the ribs, over the past 2 weeks.  Currently he states that the pain is better.  Denies injuries or trauma.  No dysuria, increased frequency or urgency, hematuria.  No associated nausea or vomiting.  Pain is worse with palpation.  Review of Systems  Positive: Flank pain Negative: Dysuria, hematuria, fever, vomiting  Physical Exam  BP (!) 134/100 (BP Location: Left Arm)   Pulse 65   Temp 98.5 F (36.9 C)   Resp 16   Ht 5\' 9"  (1.753 m)   Wt 90.7 kg   SpO2 100%   BMI 29.53 kg/m  Gen:   Awake, no distress   Resp:  Normal effort  MSK:   Moves extremities without difficulty  Other:  Palpation over the inferior costal margin in the left flank reproduces the pain somewhat  Medical Decision Making  Medically screening exam initiated at 7:54 PM.  Appropriate orders placed.  Martin Elliott was informed that the remainder of the evaluation will be completed by another provider, this initial triage assessment does not replace that evaluation, and the importance of remaining in the ED until their evaluation is complete.     Carlisle Cater, PA-C 10/23/22 1955

## 2022-10-30 ENCOUNTER — Ambulatory Visit (HOSPITAL_COMMUNITY): Admission: EM | Admit: 2022-10-30 | Discharge: 2022-10-30 | Discharge status: Against Medical Advice | Payer: Self-pay

## 2022-10-30 NOTE — ED Notes (Signed)
Called for patient in lobby no answer

## 2022-10-30 NOTE — ED Triage Notes (Signed)
Pt called from front lobby with no answer

## 2023-03-20 ENCOUNTER — Encounter (HOSPITAL_COMMUNITY): Payer: Self-pay | Admitting: Internal Medicine

## 2023-03-20 ENCOUNTER — Inpatient Hospital Stay (HOSPITAL_COMMUNITY)
Admission: EM | Admit: 2023-03-20 | Discharge: 2023-03-28 | DRG: 683 | Disposition: A | Discharge status: Home / Self Care | Payer: 59 | Attending: Internal Medicine | Admitting: Internal Medicine

## 2023-03-20 ENCOUNTER — Emergency Department (HOSPITAL_COMMUNITY): Payer: 59

## 2023-03-20 ENCOUNTER — Other Ambulatory Visit: Payer: Self-pay

## 2023-03-20 DIAGNOSIS — R7989 Other specified abnormal findings of blood chemistry: Secondary | ICD-10-CM | POA: Insufficient documentation

## 2023-03-20 DIAGNOSIS — Y990 Civilian activity done for income or pay: Secondary | ICD-10-CM

## 2023-03-20 DIAGNOSIS — T679XXA Effect of heat and light, unspecified, initial encounter: Secondary | ICD-10-CM

## 2023-03-20 DIAGNOSIS — N179 Acute kidney failure, unspecified: Secondary | ICD-10-CM | POA: Diagnosis not present

## 2023-03-20 DIAGNOSIS — E860 Dehydration: Secondary | ICD-10-CM | POA: Diagnosis present

## 2023-03-20 DIAGNOSIS — F1210 Cannabis abuse, uncomplicated: Secondary | ICD-10-CM | POA: Diagnosis present

## 2023-03-20 DIAGNOSIS — E876 Hypokalemia: Secondary | ICD-10-CM | POA: Diagnosis present

## 2023-03-20 DIAGNOSIS — W92XXXA Exposure to excessive heat of man-made origin, initial encounter: Secondary | ICD-10-CM

## 2023-03-20 DIAGNOSIS — F17210 Nicotine dependence, cigarettes, uncomplicated: Secondary | ICD-10-CM | POA: Diagnosis present

## 2023-03-20 DIAGNOSIS — M6282 Rhabdomyolysis: Secondary | ICD-10-CM | POA: Insufficient documentation

## 2023-03-20 DIAGNOSIS — Y9263 Factory as the place of occurrence of the external cause: Secondary | ICD-10-CM

## 2023-03-20 DIAGNOSIS — Z6831 Body mass index (BMI) 31.0-31.9, adult: Secondary | ICD-10-CM

## 2023-03-20 DIAGNOSIS — E669 Obesity, unspecified: Secondary | ICD-10-CM | POA: Diagnosis present

## 2023-03-20 DIAGNOSIS — F1510 Other stimulant abuse, uncomplicated: Secondary | ICD-10-CM | POA: Diagnosis present

## 2023-03-20 LAB — RAPID URINE DRUG SCREEN, HOSP PERFORMED
Amphetamines: POSITIVE — AB
Barbiturates: NOT DETECTED
Benzodiazepines: POSITIVE — AB
Cocaine: NOT DETECTED
Opiates: NOT DETECTED
Tetrahydrocannabinol: POSITIVE — AB

## 2023-03-20 LAB — COMPREHENSIVE METABOLIC PANEL
ALT: 50 U/L — ABNORMAL HIGH (ref 0–44)
AST: 61 U/L — ABNORMAL HIGH (ref 15–41)
Albumin: 4.7 g/dL (ref 3.5–5.0)
Alkaline Phosphatase: 87 U/L (ref 38–126)
Anion gap: 11 (ref 5–15)
BUN: 26 mg/dL — ABNORMAL HIGH (ref 6–20)
CO2: 20 mmol/L — ABNORMAL LOW (ref 22–32)
Calcium: 9.5 mg/dL (ref 8.9–10.3)
Chloride: 102 mmol/L (ref 98–111)
Creatinine, Ser: 1.73 mg/dL — ABNORMAL HIGH (ref 0.61–1.24)
GFR, Estimated: 53 mL/min — ABNORMAL LOW (ref >60)
Glucose, Bld: 137 mg/dL — ABNORMAL HIGH (ref 70–99)
Potassium: 3.7 mmol/L (ref 3.5–5.1)
Sodium: 133 mmol/L — ABNORMAL LOW (ref 135–145)
Total Bilirubin: 0.7 mg/dL (ref 0.3–1.2)
Total Protein: 8.1 g/dL (ref 6.5–8.1)

## 2023-03-20 LAB — CBC WITH DIFFERENTIAL/PLATELET
Abs Immature Granulocytes: 0.08 10*3/uL — ABNORMAL HIGH (ref 0.00–0.07)
Basophils Absolute: 0 10*3/uL (ref 0.0–0.1)
Basophils Relative: 0 %
Eosinophils Absolute: 0.1 10*3/uL (ref 0.0–0.5)
Eosinophils Relative: 1 %
HCT: 45.2 % (ref 39.0–52.0)
Hemoglobin: 15.2 g/dL (ref 13.0–17.0)
Immature Granulocytes: 1 %
Lymphocytes Relative: 15 %
Lymphs Abs: 1.6 10*3/uL (ref 0.7–4.0)
MCH: 29 pg (ref 26.0–34.0)
MCHC: 33.6 g/dL (ref 30.0–36.0)
MCV: 86.3 fL (ref 80.0–100.0)
Monocytes Absolute: 0.7 10*3/uL (ref 0.1–1.0)
Monocytes Relative: 7 %
Neutro Abs: 8.1 10*3/uL — ABNORMAL HIGH (ref 1.7–7.7)
Neutrophils Relative %: 76 %
Platelets: 268 10*3/uL (ref 150–400)
RBC: 5.24 MIL/uL (ref 4.22–5.81)
RDW: 13.2 % (ref 11.5–15.5)
WBC: 10.6 10*3/uL — ABNORMAL HIGH (ref 4.0–10.5)
nRBC: 0 % (ref 0.0–0.2)

## 2023-03-20 LAB — URINALYSIS, ROUTINE W REFLEX MICROSCOPIC
Bacteria, UA: NONE SEEN
Bilirubin Urine: NEGATIVE
Glucose, UA: NEGATIVE mg/dL
Ketones, ur: NEGATIVE mg/dL
Leukocytes,Ua: NEGATIVE
Nitrite: NEGATIVE
Protein, ur: 100 mg/dL — AB
Specific Gravity, Urine: 1.02 (ref 1.005–1.030)
pH: 5 (ref 5.0–8.0)

## 2023-03-20 LAB — CK: Total CK: 2032 U/L — ABNORMAL HIGH (ref 49–397)

## 2023-03-20 LAB — ETHANOL: Alcohol, Ethyl (B): <10 mg/dL (ref <10)

## 2023-03-20 LAB — ACETAMINOPHEN LEVEL: Acetaminophen (Tylenol), Serum: <10 ug/mL — ABNORMAL LOW (ref 10–30)

## 2023-03-20 LAB — CBG MONITORING, ED: Glucose-Capillary: 123 mg/dL — ABNORMAL HIGH (ref 70–99)

## 2023-03-20 LAB — SALICYLATE LEVEL: Salicylate Lvl: <7 mg/dL — ABNORMAL LOW (ref 7.0–30.0)

## 2023-03-20 MED ORDER — SODIUM CHLORIDE 0.9 % IV BOLUS
30.0000 mL/kg | Freq: Once | INTRAVENOUS | Status: AC
  Administered 2023-03-20: 2121 mL via INTRAVENOUS

## 2023-03-20 MED ORDER — HYDROCODONE-ACETAMINOPHEN 5-325 MG PO TABS
1.0000 | ORAL_TABLET | ORAL | Status: DC | PRN
  Administered 2023-03-20 – 2023-03-23 (×8): 1 via ORAL
  Filled 2023-03-20 (×8): qty 1

## 2023-03-20 MED ORDER — ENOXAPARIN SODIUM 40 MG/0.4ML IJ SOSY: 40.0000 mg | PREFILLED_SYRINGE | INTRAMUSCULAR | Status: DC

## 2023-03-20 MED ORDER — SODIUM CHLORIDE 0.9 % IV SOLN: INTRAVENOUS | Status: DC

## 2023-03-20 MED ORDER — ONDANSETRON HCL 4 MG PO TABS: 4.0000 mg | ORAL_TABLET | Freq: Four times a day (QID) | ORAL | Status: DC | PRN

## 2023-03-20 MED ORDER — ONDANSETRON HCL 4 MG/2ML IJ SOLN: 4.0000 mg | Freq: Four times a day (QID) | INTRAMUSCULAR | Status: DC | PRN

## 2023-03-20 MED ORDER — ACETAMINOPHEN 650 MG RE SUPP: 650.0000 mg | Freq: Four times a day (QID) | RECTAL | Status: DC | PRN

## 2023-03-20 MED ORDER — ACETAMINOPHEN 325 MG PO TABS
650.0000 mg | ORAL_TABLET | Freq: Four times a day (QID) | ORAL | Status: DC | PRN
  Administered 2023-03-26: 650 mg via ORAL
  Filled 2023-03-20: qty 2

## 2023-03-20 NOTE — ED Notes (Signed)
Maintence fluid held at this time, pt with bolus still infusing

## 2023-03-20 NOTE — Plan of Care (Signed)

## 2023-03-20 NOTE — H&P (Signed)
History and Physical    Martin Elliott CVE:[id number] DOB: 18-Sep-1991 DOA: 03/20/2023  PCP: Patient, No Pcp Per   Chief Complaint: Muscle cramps  HPI: Martin Elliott is a 32 y.o. male with no significant history who presents emergency department after his muscles are cramping up.  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  He is typically stays hydrated however states he could be better at drinking water.  He developed muscle cramps and felt as if his muscles were locking up.  He then became agitated and EMS was called.  He was given Versed patient was transported to the ER for assessment.  On arrival he was afebrile hemodynamically stable.  Labs were obtained which showed sodium 133, creatinine 1.7, AST 61, ALT 50, WBC 10.6, CK 2032.  Chest x-ray showed no abnormalities.  Patient was started on IV fluids due to likely dehydration and rhabdomyolysis.  On admission patient denies drug usage or other complaints.  No fevers or chills.   Review of Systems: Review of Systems  All other systems reviewed and are negative.    As per HPI otherwise 10 point review of systems negative.   No Known Allergies  No past medical history on file.  No past surgical history on file.   reports that he has been smoking cigarettes. He has been smoking an average of 1 pack per day. He has never used smokeless tobacco. He reports current alcohol use. He reports current drug use. Drug: Marijuana.  No family history on file.  Prior to Admission medications   Medication Sig Start Date End Date Taking? Authorizing Provider  acetaminophen (TYLENOL) 500 MG tablet Take 1,000 mg by mouth every 6 (six) hours as needed for moderate pain.   Yes [provider]  ibuprofen (ADVIL,MOTRIN) 800 MG tablet Take 1 tablet (800 mg total) by mouth 3 (three) times daily. Patient not taking: Reported on 03/20/2023 07/13/18   Georgetta Haber, NP    Physical Exam: Vitals:    03/20/23 1800 03/20/23 1830 03/20/23 1930 03/20/23 1945  BP: 130/82 129/86 (!) 137/92   Pulse: (!) 104 91  82  Resp: (!) 22 12 16 15   Temp:      TempSrc:      SpO2: 97% 100%  100%  Weight:      Height:       Physical Exam Constitutional:      Appearance: He is normal weight.  HENT:     Head: Normocephalic.     Nose: Nose normal.     Mouth/Throat:     Mouth: Mucous membranes are moist.     Pharynx: Oropharynx is clear.  Eyes:     Conjunctiva/sclera: Conjunctivae normal.     Pupils: Pupils are equal, round, and reactive to light.  Cardiovascular:     Rate and Rhythm: Normal rate and regular rhythm.     Pulses: Normal pulses.     Heart sounds: Normal heart sounds.  Pulmonary:     Effort: Pulmonary effort is normal.     Breath sounds: Normal breath sounds.  Abdominal:     General: Abdomen is flat. Bowel sounds are normal.  Musculoskeletal:        General: Normal range of motion.     Cervical back: Normal range of motion.  Skin:    General: Skin is warm.     Capillary Refill: Capillary refill takes less than 2 seconds.  Neurological:  General: No focal deficit present.     Mental Status: He is alert. Mental status is at baseline.  Psychiatric:        Mood and Affect: Mood normal.        Labs on Admission: I have personally reviewed the patients's labs and imaging studies.  Assessment/Plan Principal Problem:   AKI (acute kidney injury) (HCC)   # AKI most likely due to rhabdomyolysis in setting of severe dehydration, POA, active - Patient works at [employer] for that is extremely hot - Onset of symptoms 4 hours after initiation of intensive labor - No prior issues with kidney injury  Plan: Hydrate with IV fluids overnight 150 cc/h Trend labs in morning  # Elevated LFTs-likely due to dehydration and poor perfusion.  Will trend.  Will check hepatitis panel    Admission status: Observation Med-Surg  Certification: The appropriate patient  status for this patient is OBSERVATION. Observation status is judged to be reasonable and necessary in order to provide the required intensity of service to ensure the patient's safety. The patient's presenting symptoms, physical exam findings, and initial radiographic and laboratory data in the context of their medical condition is felt to place them at decreased risk for further clinical deterioration. Furthermore, it is anticipated that the patient will be medically stable for discharge from the hospital within 2 midnights of admission.     Alan Mulder MD [facility] If 7PM-7AM, please contact night-coverage [email or web address]  03/20/2023, 8:14 PM

## 2023-03-20 NOTE — ED Notes (Signed)
.ED TO INPATIENT HANDOFF REPORT  Name/Age/Gender Martin Elliott 32 y.o. male  Code Status    Code Status Orders  (From admission, onward)           Start     Ordered   03/20/23 2013  Full code  Continuous       Question:  By:  Answer:  Consent: discussion documented in EHR   03/20/23 2013           Code Status History     This patient has a current code status but no historical code status.       Home/SNF/Other Home  Chief Complaint AKI (acute kidney injury) (HCC) [N17.9]  Level of Care/Admitting Diagnosis ED Disposition     ED Disposition  Admit   Condition  --   Comment  Hospital Area: [facility] [[id number]]  Level of Care: Med-Surg [16]  May place patient in observation at [facility] or [facility] if equivalent level of care is available:: Yes  Covid Evaluation: Asymptomatic - no recent exposure (last 10 days) testing not required  Diagnosis: AKI (acute kidney injury) [facility]) [[id number]]  Admitting Physician: Alan Mulder [[id number]]  Attending Physician: Alan Mulder [phone]          Medical History No past medical history on file.  Allergies No Known Allergies  IV Location/Drains/Wounds Patient Lines/Drains/Airways Status     Active Line/Drains/Airways     Name Placement date Placement time Site Days   Peripheral IV 03/20/23 Anterior;Distal;Left Hand 03/20/23  1727  Hand  less than 1            Labs/Imaging Results for orders placed or performed during the hospital encounter of 03/20/23 (from the past 48 hour(s))  POC CBG, ED     Status: Abnormal   Collection Time: 03/20/23  5:43 PM  Result Value Ref Range   Glucose-Capillary 123 (H) 70 - 99 mg/dL    Comment: Glucose reference range applies only to samples taken after fasting for at least 8 hours.  Comprehensive metabolic panel     Status: Abnormal   Collection Time: 03/20/23  6:30 PM  Result Value Ref Range   Sodium 133 (L) 135 - 145 mmol/L    Potassium 3.7 3.5 - 5.1 mmol/L   Chloride 102 98 - 111 mmol/L   CO2 20 (L) 22 - 32 mmol/L   Glucose, Bld 137 (H) 70 - 99 mg/dL    Comment: Glucose reference range applies only to samples taken after fasting for at least 8 hours.   BUN 26 (H) 6 - 20 mg/dL   Creatinine, Ser 2.95 (H) 0.61 - 1.24 mg/dL   Calcium 9.5 8.9 - 62.1 mg/dL   Total Protein 8.1 6.5 - 8.1 g/dL   Albumin 4.7 3.5 - 5.0 g/dL   AST 61 (H) 15 - 41 U/L   ALT 50 (H) 0 - 44 U/L   Alkaline Phosphatase 87 38 - 126 U/L   Total Bilirubin 0.7 0.3 - 1.2 mg/dL   GFR, Estimated 53 (L) >60 mL/min    Comment: (NOTE) Calculated using the CKD-EPI Creatinine Equation (2021)    Anion gap 11 5 - 15    Comment: Performed at [facility], [address], [place], [place] [address]  CBC with Differential     Status: Abnormal   Collection Time: 03/20/23  6:30 PM  Result Value Ref Range   WBC 10.6 (H) 4.0 - 10.5 K/uL   RBC 5.24 4.22 -  5.81 MIL/uL   Hemoglobin 15.2 13.0 - 17.0 g/dL   HCT 21.3 08.6 - 57.8 %   MCV 86.3 80.0 - 100.0 fL   MCH 29.0 26.0 - 34.0 pg   MCHC 33.6 30.0 - 36.0 g/dL   RDW 46.9 62.9 - 52.8 %   Platelets 268 150 - 400 K/uL   nRBC 0.0 0.0 - 0.2 %   Neutrophils Relative % 76 %   Neutro Abs 8.1 (H) 1.7 - 7.7 K/uL   Lymphocytes Relative 15 %   Lymphs Abs 1.6 0.7 - 4.0 K/uL   Monocytes Relative 7 %   Monocytes Absolute 0.7 0.1 - 1.0 K/uL   Eosinophils Relative 1 %   Eosinophils Absolute 0.1 0.0 - 0.5 K/uL   Basophils Relative 0 %   Basophils Absolute 0.0 0.0 - 0.1 K/uL   Immature Granulocytes 1 %   Abs Immature Granulocytes 0.08 (H) 0.00 - 0.07 K/uL    Comment: Performed at [facility], [address], [place], [place] [address]  CK     Status: Abnormal   Collection Time: 03/20/23  6:30 PM  Result Value Ref Range   Total CK 2,032 (H) 49 - 397 U/L    Comment: Performed at [facility], [address] [place], [place] [address]  Ethanol     Status: None    Collection Time: 03/20/23  6:30 PM  Result Value Ref Range   Alcohol, Ethyl (B) <10 <10 mg/dL    Comment: (NOTE) Lowest detectable limit for serum alcohol is 10 mg/dL.  For medical purposes only. Performed at [facility], [address], [place], [place] [address]   Acetaminophen level     Status: Abnormal   Collection Time: 03/20/23  6:30 PM  Result Value Ref Range   Acetaminophen (Tylenol), Serum <10 (L) 10 - 30 ug/mL    Comment: (NOTE) Therapeutic concentrations vary significantly. A range of 10-30 ug/mL  may be an effective concentration for many patients. However, some  are best treated at concentrations outside of this range. Acetaminophen concentrations >150 ug/mL at 4 hours after ingestion  and >50 ug/mL at 12 hours after ingestion are often associated with  toxic reactions.  Performed at [facility], [address], [place], [place] [address]   Salicylate level     Status: Abnormal   Collection Time: 03/20/23  6:30 PM  Result Value Ref Range   Salicylate Lvl <7.0 (L) 7.0 - 30.0 mg/dL    Comment: Performed at [facility], [address], [place], [place] [address]  Urinalysis, Routine w reflex microscopic -Urine, Clean Catch     Status: Abnormal   Collection Time: 03/20/23  7:46 PM  Result Value Ref Range   Color, Urine YELLOW YELLOW   APPearance CLEAR CLEAR   Specific Gravity, Urine 1.020 1.005 - 1.030   pH 5.0 5.0 - 8.0   Glucose, UA NEGATIVE NEGATIVE mg/dL   Hgb urine dipstick SMALL (A) NEGATIVE   Bilirubin Urine NEGATIVE NEGATIVE   Ketones, ur NEGATIVE NEGATIVE mg/dL   Protein, ur 956 (A) NEGATIVE mg/dL   Nitrite NEGATIVE NEGATIVE   Leukocytes,Ua NEGATIVE NEGATIVE   RBC / HPF 0-5 0 - 5 RBC/hpf   WBC, UA 0-5 0 - 5 WBC/hpf   Bacteria, UA NONE SEEN NONE SEEN   Squamous Epithelial / HPF 0-5 0 - 5 /HPF   Mucus PRESENT    Hyaline Casts, UA PRESENT     Comment:  Performed at [facility], [address], [place], [place] [address]  Urine rapid drug screen (hosp performed)     Status: Abnormal   Collection Time: 03/20/23  8:15 PM  Result Value Ref Range   Opiates NONE DETECTED NONE DETECTED   Cocaine NONE DETECTED NONE DETECTED   Benzodiazepines POSITIVE (A) NONE DETECTED   Amphetamines POSITIVE (A) NONE DETECTED   Tetrahydrocannabinol POSITIVE (A) NONE DETECTED   Barbiturates NONE DETECTED NONE DETECTED    Comment: (NOTE) DRUG SCREEN FOR MEDICAL PURPOSES ONLY.  IF CONFIRMATION IS NEEDED FOR ANY PURPOSE, NOTIFY LAB WITHIN 5 DAYS.  LOWEST DETECTABLE LIMITS FOR URINE DRUG SCREEN Drug Class                     Cutoff (ng/mL) Amphetamine and metabolites    1000 Barbiturate and metabolites    200 Benzodiazepine                 200 Opiates and metabolites        300 Cocaine and metabolites        300 THC                            50 Performed at [facility], [address] [place], [place] [address]    DG Chest Port 1 View  Result Date: 03/20/2023 CLINICAL DATA:  Acute onset muscle cramps and diaphoresis EXAM: PORTABLE CHEST 1 VIEW COMPARISON:  Chest radiograph dated 08/03/2022 FINDINGS: Normal lung volumes. No focal consolidations. No pleural effusion or pneumothorax. The heart size and mediastinal contours are within normal limits. No acute osseous abnormality. IMPRESSION: No active disease. Electronically Signed   By: Agustin Cree M.D.   On: 03/20/2023 18:39    Pending Labs Unresulted Labs (From admission, onward)     Start     Ordered   03/21/23 0500  Basic metabolic panel  Tomorrow morning,   R        03/20/23 2013   03/21/23 0500  CK  Tomorrow morning,   R        03/20/23 2013   03/21/23 0500  Hepatitis panel, acute  Tomorrow morning,   R        03/20/23 2019   03/21/23 0500  Hepatic function panel  Tomorrow morning,   R        03/20/23 2020            Vitals/Pain Today's Vitals   03/20/23 1930 03/20/23 1945 03/20/23  2000 03/20/23 2100  BP: (!) 137/92  (!) 150/88 (!) 148/96  Pulse:  82 81 80  Resp: 16 15 18  (!) 23  Temp:      TempSrc:      SpO2:  100% 100% 100%  Weight:      Height:      PainSc:        Isolation Precautions No active isolations  Medications Medications  0.9 %  sodium chloride infusion (has no administration in time range)  acetaminophen (TYLENOL) tablet 650 mg (has no administration in time range)    Or  acetaminophen (TYLENOL) suppository 650 mg (has no administration in time range)  HYDROcodone-acetaminophen (NORCO/VICODIN) 5-325 MG per tablet 1-2 tablet (has no administration in time range)  ondansetron (ZOFRAN) tablet 4 mg (has no administration in time range)    Or  ondansetron (ZOFRAN) injection 4 mg (has no administration in time range)  sodium  chloride 0.9 % bolus 2,121 mL (2,121 mLs Intravenous New Bag/Given 03/20/23 1833)    Mobility walks

## 2023-03-20 NOTE — ED Triage Notes (Signed)
Pt BIBA from street. Pt had profound muscle cramps and diaphoresis. Pt agitated EMS gave 5mg  Versed IM which relieved cramping and calmed agitation.  Pt Aox4  BP: 128/84 HR: 100 SPO2: 97 RA CBG: 111

## 2023-03-20 NOTE — ED Notes (Signed)
Pt SPO2 dropped to 91 while sleeping. Placed on 3L [place]

## 2023-03-20 NOTE — ED Provider Notes (Signed)
[place] EMERGENCY DEPARTMENT AT [facility] Provider Note  CSN: [id number] Arrival date & time: 03/20/23 1726  Chief Complaint(s) muscle cramps  HPI Martin Elliott is a 32 y.o. male who denies any significant past medical history presenting to the emergency department with muscle cramps.  Patient reports that his whole body was "locking up "and reports he was out in the sun.  Apparently the paramedics were called because the the patient was in the street diaphoretic complaining of muscle cramps and was agitated.  They gave the patient 5 mg of Versed.  Patient reports he feels better after this.  He denies any fevers or chills.  Denies any drug or alcohol use.  Denies any chest or abdominal pain.  Past Medical History No past medical history on file. There are no problems to display for this patient.  Home Medication(s) Prior to Admission medications   Medication Sig Start Date End Date Taking? Authorizing Provider  ibuprofen (ADVIL,MOTRIN) 800 MG tablet Take 1 tablet (800 mg total) by mouth 3 (three) times daily. 07/13/18   Georgetta Haber, NP                                                                                                                                    Past Surgical History No past surgical history on file. Family History No family history on file.  Social History Social History   Tobacco Use   Smoking status: Every Day    Packs/day: 1    Types: Cigarettes   Smokeless tobacco: Never  Vaping Use   Vaping Use: Never used  Substance Use Topics   Alcohol use: Yes   Drug use: Yes    Types: Marijuana   Allergies Patient has no known allergies.  Review of Systems Review of Systems  All other systems reviewed and are negative.   Physical Exam Vital Signs  I have reviewed the triage vital signs BP (!) 137/92   Pulse 82   Temp (!) 97.4 F (36.3 C) (Oral)   Resp 15   Ht 5\' 9"  (1.753 m)   Wt 96.2 kg   SpO2 100%   BMI 31.31 kg/m   Physical Exam Vitals and nursing note reviewed.  Constitutional:      General: He is not in acute distress.    Appearance: Normal appearance.  HENT:     Mouth/Throat:     Mouth: Mucous membranes are dry.  Eyes:     Conjunctiva/sclera: Conjunctivae normal.  Cardiovascular:     Rate and Rhythm: Regular rhythm. Tachycardia present.  Pulmonary:     Effort: Pulmonary effort is normal. No respiratory distress.     Breath sounds: Normal breath sounds.  Abdominal:     General: Abdomen is flat.     Palpations: Abdomen is soft.     Tenderness: There is no abdominal tenderness.  Musculoskeletal:     Right lower leg: No  edema.     Left lower leg: No edema.  Skin:    General: Skin is warm and dry.     Capillary Refill: Capillary refill takes less than 2 seconds.  Neurological:     Mental Status: He is alert and oriented to person, place, and time. Mental status is at baseline.  Psychiatric:        Mood and Affect: Mood normal.        Behavior: Behavior normal.     ED Results and Treatments Labs (all labs ordered are listed, but only abnormal results are displayed) Labs Reviewed  COMPREHENSIVE METABOLIC PANEL - Abnormal; Notable for the following components:      Result Value   Sodium 133 (*)    CO2 20 (*)    Glucose, Bld 137 (*)    BUN 26 (*)    Creatinine, Ser 1.73 (*)    AST 61 (*)    ALT 50 (*)    GFR, Estimated 53 (*)    All other components within normal limits  CBC WITH DIFFERENTIAL/PLATELET - Abnormal; Notable for the following components:   WBC 10.6 (*)    Neutro Abs 8.1 (*)    Abs Immature Granulocytes 0.08 (*)    All other components within normal limits  CK - Abnormal; Notable for the following components:   Total CK 2,032 (*)    All other components within normal limits  ACETAMINOPHEN LEVEL - Abnormal; Notable for the following components:   Acetaminophen (Tylenol), Serum <10 (*)    All other components within normal limits  SALICYLATE LEVEL - Abnormal;  Notable for the following components:   Salicylate Lvl <7.0 (*)    All other components within normal limits  CBG MONITORING, ED - Abnormal; Notable for the following components:   Glucose-Capillary 123 (*)    All other components within normal limits  ETHANOL  URINALYSIS, ROUTINE W REFLEX MICROSCOPIC  RAPID URINE DRUG SCREEN, HOSP PERFORMED                                                                                                                          Radiology DG Chest Port 1 View  Result Date: 03/20/2023 CLINICAL DATA:  Acute onset muscle cramps and diaphoresis EXAM: PORTABLE CHEST 1 VIEW COMPARISON:  Chest radiograph dated 08/03/2022 FINDINGS: Normal lung volumes. No focal consolidations. No pleural effusion or pneumothorax. The heart size and mediastinal contours are within normal limits. No acute osseous abnormality. IMPRESSION: No active disease. Electronically Signed   By: Agustin Cree M.D.   On: 03/20/2023 18:39    Pertinent labs & imaging results that were available during my care of the patient were reviewed by me and considered in my medical decision making (see MDM for details).  Medications Ordered in ED Medications  sodium chloride 0.9 % bolus 2,121 mL (2,121 mLs Intravenous New Bag/Given 03/20/23 1833)  Procedures Procedures  (including critical care time)  Medical Decision Making / ED Course   MDM:  32 year old male presenting to the emergency department with muscle cramps.  Patient reports his symptoms have improved after medication given by paramedics.  Unclear cause, could be drug or alcohol use, heat exposure, electrolyte abnormality.  Will check lab testing including ethanol level.  Will also check CK to evaluate for rhabdomyolysis.  Will give fluids as patient does appear dehydrated.  Will reassess.  Clinical Course as of  03/20/23 1948  Wynelle Link Mar 20, 2023  1947 Labs notable for elevated CK, acute kidney injury.  Discussed with the hospitalist who will admit the patient for observation and further IV fluid rehydration. [WS]    Clinical Course User Index [WS] Lonell Grandchild, MD     Additional history obtained: -Additional history obtained from ems -External records from outside source obtained and reviewed including: Chart review including previous notes, labs, imaging, consultation notes including prior ER visit   Lab Tests: -I ordered, reviewed, and interpreted labs.   The pertinent results include:   Labs Reviewed  COMPREHENSIVE METABOLIC PANEL - Abnormal; Notable for the following components:      Result Value   Sodium 133 (*)    CO2 20 (*)    Glucose, Bld 137 (*)    BUN 26 (*)    Creatinine, Ser 1.73 (*)    AST 61 (*)    ALT 50 (*)    GFR, Estimated 53 (*)    All other components within normal limits  CBC WITH DIFFERENTIAL/PLATELET - Abnormal; Notable for the following components:   WBC 10.6 (*)    Neutro Abs 8.1 (*)    Abs Immature Granulocytes 0.08 (*)    All other components within normal limits  CK - Abnormal; Notable for the following components:   Total CK 2,032 (*)    All other components within normal limits  ACETAMINOPHEN LEVEL - Abnormal; Notable for the following components:   Acetaminophen (Tylenol), Serum <10 (*)    All other components within normal limits  SALICYLATE LEVEL - Abnormal; Notable for the following components:   Salicylate Lvl <7.0 (*)    All other components within normal limits  CBG MONITORING, ED - Abnormal; Notable for the following components:   Glucose-Capillary 123 (*)    All other components within normal limits  ETHANOL  URINALYSIS, ROUTINE W REFLEX MICROSCOPIC  RAPID URINE DRUG SCREEN, HOSP PERFORMED    Notable for elevated CK, AKI  EKG   EKG Interpretation  Date/Time:  Sunday March 20 2023 17:46:41 EDT Ventricular Rate:  106 PR  Interval:  104 QRS Duration: 96 QT Interval:  329 QTC Calculation: 437 R Axis:   101 Text Interpretation: Sinus tachycardia Probable left atrial enlargement Right axis deviation Confirmed by Alvino Blood ([id number]) on 03/20/2023 6:27:33 PM         Medicines ordered and prescription drug management: Meds ordered this encounter  Medications   sodium chloride 0.9 % bolus 2,121 mL    -I have reviewed the patients home medicines and have made adjustments as needed   Consultations Obtained: I requested consultation with the hospitalist,  and discussed lab and imaging findings as well as pertinent plan - they recommend: admission  Cardiac Monitoring: The patient was maintained on a cardiac monitor.  I personally viewed and interpreted the cardiac monitored which showed an underlying rhythm of: NSR  Social Determinants of Health:  Diagnosis or treatment significantly limited by social determinants  of health: obesity   Reevaluation: After the interventions noted above, I reevaluated the patient and found that their symptoms have improved  Co morbidities that complicate the patient evaluation No past medical history on file.    Dispostion: Disposition decision including need for hospitalization was considered, and patient discharged from emergency department.    Final Clinical Impression(s) / ED Diagnoses Final diagnoses:  Non-traumatic rhabdomyolysis  Heat exposure, initial encounter  Acute kidney injury [facility])     This chart was dictated using voice recognition software.  Despite best efforts to proofread,  errors can occur which can change the documentation meaning.    Lonell Grandchild, MD 03/20/23 [phone]

## 2023-03-21 DIAGNOSIS — T679XXA Effect of heat and light, unspecified, initial encounter: Secondary | ICD-10-CM | POA: Diagnosis not present

## 2023-03-21 DIAGNOSIS — N179 Acute kidney failure, unspecified: Secondary | ICD-10-CM | POA: Diagnosis present

## 2023-03-21 DIAGNOSIS — F1510 Other stimulant abuse, uncomplicated: Secondary | ICD-10-CM | POA: Diagnosis present

## 2023-03-21 DIAGNOSIS — E876 Hypokalemia: Secondary | ICD-10-CM | POA: Diagnosis present

## 2023-03-21 DIAGNOSIS — Y9263 Factory as the place of occurrence of the external cause: Secondary | ICD-10-CM | POA: Diagnosis not present

## 2023-03-21 DIAGNOSIS — Y990 Civilian activity done for income or pay: Secondary | ICD-10-CM | POA: Diagnosis not present

## 2023-03-21 DIAGNOSIS — E860 Dehydration: Secondary | ICD-10-CM | POA: Diagnosis present

## 2023-03-21 DIAGNOSIS — W92XXXA Exposure to excessive heat of man-made origin, initial encounter: Secondary | ICD-10-CM | POA: Diagnosis not present

## 2023-03-21 DIAGNOSIS — F1210 Cannabis abuse, uncomplicated: Secondary | ICD-10-CM | POA: Diagnosis present

## 2023-03-21 DIAGNOSIS — Z6831 Body mass index (BMI) 31.0-31.9, adult: Secondary | ICD-10-CM | POA: Diagnosis not present

## 2023-03-21 DIAGNOSIS — F17210 Nicotine dependence, cigarettes, uncomplicated: Secondary | ICD-10-CM | POA: Diagnosis present

## 2023-03-21 DIAGNOSIS — M6282 Rhabdomyolysis: Secondary | ICD-10-CM | POA: Diagnosis present

## 2023-03-21 DIAGNOSIS — E669 Obesity, unspecified: Secondary | ICD-10-CM | POA: Diagnosis present

## 2023-03-21 LAB — BASIC METABOLIC PANEL
Anion gap: 9 (ref 5–15)
BUN: 19 mg/dL (ref 6–20)
CO2: 20 mmol/L — ABNORMAL LOW (ref 22–32)
Calcium: 8.5 mg/dL — ABNORMAL LOW (ref 8.9–10.3)
Chloride: 108 mmol/L (ref 98–111)
Creatinine, Ser: 1.03 mg/dL (ref 0.61–1.24)
GFR, Estimated: >60 mL/min (ref >60)
Glucose, Bld: 95 mg/dL (ref 70–99)
Potassium: 3.4 mmol/L — ABNORMAL LOW (ref 3.5–5.1)
Sodium: 137 mmol/L (ref 135–145)

## 2023-03-21 LAB — CBC
HCT: 41.7 % (ref 39.0–52.0)
Hemoglobin: 13.7 g/dL (ref 13.0–17.0)
MCH: 29 pg (ref 26.0–34.0)
MCHC: 32.9 g/dL (ref 30.0–36.0)
MCV: 88.3 fL (ref 80.0–100.0)
Platelets: 246 10*3/uL (ref 150–400)
RBC: 4.72 MIL/uL (ref 4.22–5.81)
RDW: 13.4 % (ref 11.5–15.5)
WBC: 10.4 10*3/uL (ref 4.0–10.5)
nRBC: 0 % (ref 0.0–0.2)

## 2023-03-21 LAB — HIV ANTIBODY (ROUTINE TESTING W REFLEX): HIV Screen 4th Generation wRfx: NONREACTIVE

## 2023-03-21 LAB — HEPATITIS PANEL, ACUTE
HCV Ab: NONREACTIVE
Hep A IgM: NONREACTIVE
Hep B C IgM: NONREACTIVE
Hepatitis B Surface Ag: NONREACTIVE

## 2023-03-21 LAB — HEPATIC FUNCTION PANEL
ALT: 43 U/L (ref 0–44)
AST: 93 U/L — ABNORMAL HIGH (ref 15–41)
Albumin: 3.8 g/dL (ref 3.5–5.0)
Alkaline Phosphatase: 71 U/L (ref 38–126)
Bilirubin, Direct: 0.1 mg/dL (ref 0.0–0.2)
Indirect Bilirubin: 0.7 mg/dL (ref 0.3–0.9)
Total Bilirubin: 0.8 mg/dL (ref 0.3–1.2)
Total Protein: 6.5 g/dL (ref 6.5–8.1)

## 2023-03-21 LAB — CK: Total CK: 4647 U/L — ABNORMAL HIGH (ref 49–397)

## 2023-03-21 MED ORDER — POTASSIUM CHLORIDE CRYS ER 20 MEQ PO TBCR
40.0000 meq | EXTENDED_RELEASE_TABLET | Freq: Once | ORAL | Status: AC
  Administered 2023-03-21: 40 meq via ORAL
  Filled 2023-03-21: qty 2

## 2023-03-21 NOTE — TOC Initial Note (Signed)
Transition of Care [facility]) - Initial/Assessment Note    Patient Details  Name: Martin Elliott MRN: [id number] Date of Birth: [birth year]  Transition of Care [facility]) CM/SW Contact:    Otelia Santee, LCSW Phone Number: 03/21/2023, 1:30 PM  Clinical Narrative:                 Met with pt as he has no insurance and no PCP. Pt agreeable to PCP being arranged. PCP appt scheduled at [facility] on Wednesday, 03/30/23 at 2:20pm. Information placed on f/u. No further TOC needs identified.   Expected Discharge Plan: Home/Self Care Barriers to Discharge: No Barriers Identified   Patient Goals and CMS Choice Patient states their goals for this hospitalization and ongoing recovery are:: To go home CMS Medicare.gov Compare Post Acute Care list provided to:: Patient Choice offered to / list presented to : Patient [place] ownership interest in [facility].provided to::  (NA)    Expected Discharge Plan and Services In-house Referral: NA Discharge Planning Services: Follow-up appt scheduled Post Acute Care Choice: NA Living arrangements for the past 2 months: Apartment                 DME Arranged: N/A DME Agency: NA                  Prior Living Arrangements/Services Living arrangements for the past 2 months: Apartment Lives with:: Significant Other Patient language and need for interpreter reviewed:: Yes Do you feel safe going back to the place where you live?: Yes      Need for Family Participation in Patient Care: No (Comment) Care giver support system in place?: No (comment)   Criminal Activity/Legal Involvement Pertinent to Current Situation/Hospitalization: No - Comment as needed  Activities of Daily Living Home Assistive Devices/Equipment: None ADL Screening (condition at time of admission) Patient's cognitive ability adequate to safely complete daily activities?: Yes Is the patient deaf or have difficulty hearing?: No Does the patient have  difficulty seeing, even when wearing glasses/contacts?: No Does the patient have difficulty concentrating, remembering, or making decisions?: No Patient able to express need for assistance with ADLs?: Yes Does the patient have difficulty dressing or bathing?: No Independently performs ADLs?: Yes (appropriate for developmental age) Does the patient have difficulty walking or climbing stairs?: No Weakness of Legs: Both Weakness of Arms/Hands: None  Permission Sought/Granted Permission sought to share information with : PCP Permission granted to share information with : Yes, Verbal Permission Granted              Emotional Assessment Appearance:: Appears stated age Attitude/Demeanor/Rapport: Engaged Affect (typically observed): Accepting Orientation: : Oriented to  Time, Oriented to Place, Oriented to Self, Oriented to Situation Alcohol / Substance Use: Not Applicable Psych Involvement: No (comment)  Admission diagnosis:  AKI (acute kidney injury) (HCC) [N17.9] Acute kidney injury (HCC) [N17.9] Non-traumatic rhabdomyolysis [M62.82] Heat exposure, initial encounter [T67.9XXA] Patient Active Problem List   Diagnosis Date Noted   AKI (acute kidney injury) (HCC) 03/20/2023   PCP:  Patient, No Pcp Per Pharmacy:   [facility] [phone] - [place],  - [address] AT [facility] [address] [place] [address] Phone: [phone] Fax: [phone]     Social Determinants of Health (SDOH) Social History: SDOH Screenings   Food Insecurity: No Food Insecurity (03/20/2023)  Housing: Low Risk  (03/20/2023)  Transportation Needs: No Transportation Needs (03/20/2023)  Utilities: Not At Risk (03/20/2023)  Tobacco Use: High  Risk (03/20/2023)   SDOH Interventions:     Readmission Risk Interventions     No data to display

## 2023-03-21 NOTE — Progress Notes (Signed)
PROGRESS NOTE  DURELL LOFASO WUJ:[id number] DOB: January 02, 1991 DOA: 03/20/2023 PCP: Patient, No Pcp Per  HPI/Recap of past 24 hours: Martin Elliott is a 32 y.o. male with no significant history who presents to the ED, after he reported severe muscle cramp in his BLE at work. 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 He developed muscle cramps and felt as if his muscles were locking up. Due to the severe pain and some ?agitation EMS was called. Pt was given Versed.  In the ED, patient showed a CK level of greater than 2000, with some mild transaminitis.  Pt was admitted for further management.    Today, patient still reporting muscle cramping in left lower extremity, but does note some improvement.  Patient denies any new complaints.  Advised patient to drink orally as well.   Assessment/Plan: Principal Problem:   AKI (acute kidney injury) (HCC)   AKI likely 2/2 rhabdomyolysis in the setting of the severe dehydration Improving IV fluids  Rhabdomyolysis CK level 2032-->4647, will trend Continue aggressive IV fluids Pain management as needed  Mild transaminitis Likely 2/2 from rhabdo Daily CMP  Hypokalemia Replace as needed  Tobacco abuse Polysubstance abuse Smokes about half a pack of tobacco daily Urine drug screen positive for amphetamines, marijuana, (benzodiazepines-received Versed by EMS) Nicotine patch offered, refused for now Advised to quit  Obesity Lifestyle modification advised   Estimated body mass index is 31.31 kg/m as calculated from the following:   Height as of this encounter: 5\' 9"  (1.753 m).   Weight as of this encounter: 96.2 kg.     Code Status: Full  Family Communication: None at bedside  Disposition Plan: Status is: Observation The patient will require care spanning > 2 midnights and should be moved to inpatient because: Level of  care      Consultants: None  Procedures: None  Antimicrobials: None  DVT prophylaxis: SCDs-encouraged to ambulate   Objective: Vitals:   03/20/23 2100 03/20/23 2202 03/21/23 0231 03/21/23 0629  BP: (!) 148/96 (!) 130/93 (!) 136/90 120/73  Pulse: 80 83 70 67  Resp: (!) 23 18 18 18   Temp:  98.3 F (36.8 C) 97.8 F (36.6 C) 98.3 F (36.8 C)  TempSrc:  Oral Oral   SpO2: 100% 98% 98% 98%  Weight:      Height:        Intake/Output Summary (Last 24 hours) at 03/21/2023 1147 Last data filed at 03/21/2023 0600 Gross per 24 hour  Intake 1125.48 ml  Output --  Net 1125.48 ml   Filed Weights   03/20/23 1742  Weight: 96.2 kg    Exam: General: NAD  Cardiovascular: S1, S2 present Respiratory: CTAB Abdomen: Soft, nontender, nondistended, bowel sounds present Musculoskeletal: No bilateral pedal edema noted Skin: Normal Psychiatry: Normal mood    Data Reviewed: CBC: Recent Labs  Lab 03/20/23 1830 03/21/23 0417  WBC 10.6* 10.4  NEUTROABS 8.1*  --   HGB 15.2 13.7  HCT 45.2 41.7  MCV 86.3 88.3  PLT 268 246   Basic Metabolic Panel: Recent Labs  Lab 03/20/23 1830 03/21/23 0344  NA 133* 137  K 3.7 3.4*  CL 102 108  CO2 20* 20*  GLUCOSE 137* 95  BUN 26* 19  CREATININE 1.73* 1.03  CALCIUM 9.5 8.5*   GFR: Estimated Creatinine Clearance: 118.9 mL/min (by C-G formula based on SCr of 1.03 mg/dL). Liver Function Tests: Recent Labs  Lab 03/20/23 1830  03/21/23 0344  AST 61* 93*  ALT 50* 43  ALKPHOS 87 71  BILITOT 0.7 0.8  PROT 8.1 6.5  ALBUMIN 4.7 3.8   No results for input(s): "LIPASE", "AMYLASE" in the last 168 hours. No results for input(s): "AMMONIA" in the last 168 hours. Coagulation Profile: No results for input(s): "INR", "PROTIME" in the last 168 hours. Cardiac Enzymes: Recent Labs  Lab 03/20/23 1830 03/21/23 0344  CKTOTAL 2,032* 4,647*   BNP (last 3 results) No results for input(s): "PROBNP" in the last 8760 hours. HbA1C: No  results for input(s): "HGBA1C" in the last 72 hours. CBG: Recent Labs  Lab 03/20/23 1743  GLUCAP 123*   Lipid Profile: No results for input(s): "CHOL", "HDL", "LDLCALC", "TRIG", "CHOLHDL", "LDLDIRECT" in the last 72 hours. Thyroid Function Tests: No results for input(s): "TSH", "T4TOTAL", "FREET4", "T3FREE", "THYROIDAB" in the last 72 hours. Anemia Panel: No results for input(s): "VITAMINB12", "FOLATE", "FERRITIN", "TIBC", "IRON", "RETICCTPCT" in the last 72 hours. Urine analysis:    Component Value Date/Time   COLORURINE YELLOW 03/20/2023 1946   APPEARANCEUR CLEAR 03/20/2023 1946   LABSPEC 1.020 03/20/2023 1946   PHURINE 5.0 03/20/2023 1946   GLUCOSEU NEGATIVE 03/20/2023 1946   HGBUR SMALL (A) 03/20/2023 1946   BILIRUBINUR NEGATIVE 03/20/2023 1946   KETONESUR NEGATIVE 03/20/2023 1946   PROTEINUR 100 (A) 03/20/2023 1946   NITRITE NEGATIVE 03/20/2023 1946   LEUKOCYTESUR NEGATIVE 03/20/2023 1946   Sepsis Labs: @LABRCNTIP (procalcitonin:4,lacticidven:4)  )No results found for this or any previous visit (from the past 240 hour(s)).    Studies: DG Chest Port 1 View  Result Date: 03/20/2023 CLINICAL DATA:  Acute onset muscle cramps and diaphoresis EXAM: PORTABLE CHEST 1 VIEW COMPARISON:  Chest radiograph dated 08/03/2022 FINDINGS: Normal lung volumes. No focal consolidations. No pleural effusion or pneumothorax. The heart size and mediastinal contours are within normal limits. No acute osseous abnormality. IMPRESSION: No active disease. Electronically Signed   By: Agustin Cree M.D.   On: 03/20/2023 18:39    Scheduled Meds:  Continuous Infusions:  sodium chloride 150 mL/hr at 03/21/23 1133     LOS: 0 days     Briant Cedar, MD [facility]  If 7PM-7AM, please contact night-coverage [email or web address] 03/21/2023, 11:47 AM

## 2023-03-22 DIAGNOSIS — N179 Acute kidney failure, unspecified: Secondary | ICD-10-CM | POA: Diagnosis not present

## 2023-03-22 LAB — COMPREHENSIVE METABOLIC PANEL
ALT: 53 U/L — ABNORMAL HIGH (ref 0–44)
AST: 219 U/L — ABNORMAL HIGH (ref 15–41)
Albumin: 3.2 g/dL — ABNORMAL LOW (ref 3.5–5.0)
Alkaline Phosphatase: 62 U/L (ref 38–126)
Anion gap: 7 (ref 5–15)
BUN: 13 mg/dL (ref 6–20)
CO2: 20 mmol/L — ABNORMAL LOW (ref 22–32)
Calcium: 8 mg/dL — ABNORMAL LOW (ref 8.9–10.3)
Chloride: 112 mmol/L — ABNORMAL HIGH (ref 98–111)
Creatinine, Ser: 0.88 mg/dL (ref 0.61–1.24)
GFR, Estimated: >60 mL/min (ref >60)
Glucose, Bld: 86 mg/dL (ref 70–99)
Potassium: 3.8 mmol/L (ref 3.5–5.1)
Sodium: 139 mmol/L (ref 135–145)
Total Bilirubin: 0.8 mg/dL (ref 0.3–1.2)
Total Protein: 5.7 g/dL — ABNORMAL LOW (ref 6.5–8.1)

## 2023-03-22 LAB — CK: Total CK: 13964 U/L — ABNORMAL HIGH (ref 49–397)

## 2023-03-22 MED ORDER — ENOXAPARIN SODIUM 40 MG/0.4ML IJ SOSY
40.0000 mg | PREFILLED_SYRINGE | Freq: Every day | INTRAMUSCULAR | Status: DC
  Administered 2023-03-22 – 2023-03-27 (×6): 40 mg via SUBCUTANEOUS
  Filled 2023-03-22 (×6): qty 0.4

## 2023-03-22 NOTE — Progress Notes (Signed)
PROGRESS NOTE  LAYMAN GULLY ZOX:[id number] DOB: 04/05/1991 DOA: 03/20/2023 PCP: Patient, No Pcp Per  HPI/Recap of past 24 hours: Martin Elliott is a 32 y.o. male with no significant history who presents to the ED, after he reported severe muscle cramp in his BLE at work. 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 He developed muscle cramps and felt as if his muscles were locking up. Due to the severe pain and some ?agitation EMS was called. Pt was given Versed.  In the ED, patient showed a CK level of greater than 2000, with some mild transaminitis.  Pt was admitted for further management.      Today, patient still complaining of bilateral lower extremity pain.  Encouraged to stay hydrated by drinking orally as well.   Assessment/Plan: Principal Problem:   AKI (acute kidney injury) (HCC)   AKI likely 2/2 rhabdomyolysis in the setting of the severe dehydration Improving IV fluids  Rhabdomyolysis CK level 2032-->4647-->13,964 Continue aggressive IV fluids Pain management as needed  Mild transaminitis Likely 2/2 from rhabdo Daily CMP  Hypokalemia Replace as needed  Tobacco abuse Polysubstance abuse Smokes about half a pack of tobacco daily Urine drug screen positive for amphetamines, marijuana, (benzodiazepines-received Versed by EMS) Nicotine patch offered, refused for now Advised to quit  Obesity Lifestyle modification advised   Estimated body mass index is 31.31 kg/m as calculated from the following:   Height as of this encounter: 5\' 9"  (1.753 m).   Weight as of this encounter: 96.2 kg.     Code Status: Full  Family Communication: None at bedside  Disposition Plan: Status is: Inpatient The patient will require care spanning > 2 midnights and should be moved to inpatient because: Level of care      Consultants: None  Procedures: None  Antimicrobials: None  DVT  prophylaxis: Lovenox   Objective: Vitals:   03/21/23 1746 03/21/23 1943 03/22/23 0600 03/22/23 1400  BP: 129/89 129/83 120/76 133/84  Pulse: 77 64 68 (!) 53  Resp: 18 17 18 16   Temp: 98.9 F (37.2 C) 98.9 F (37.2 C) 98.2 F (36.8 C) 97.6 F (36.4 C)  TempSrc: Oral Oral Oral Oral  SpO2: 100% 100% 100% 100%  Weight:      Height:        Intake/Output Summary (Last 24 hours) at 03/22/2023 1548 Last data filed at 03/21/2023 2002 Gross per 24 hour  Intake 120 ml  Output --  Net 120 ml   Filed Weights   03/20/23 1742  Weight: 96.2 kg    Exam: General: NAD  Cardiovascular: S1, S2 present Respiratory: CTAB Abdomen: Soft, nontender, nondistended, bowel sounds present Musculoskeletal: No bilateral pedal edema noted Skin: Normal Psychiatry: Normal mood    Data Reviewed: CBC: Recent Labs  Lab 03/20/23 1830 03/21/23 0417  WBC 10.6* 10.4  NEUTROABS 8.1*  --   HGB 15.2 13.7  HCT 45.2 41.7  MCV 86.3 88.3  PLT 268 246   Basic Metabolic Panel: Recent Labs  Lab 03/20/23 1830 03/21/23 0344 03/22/23 0343  NA 133* 137 139  K 3.7 3.4* 3.8  CL 102 108 112*  CO2 20* 20* 20*  GLUCOSE 137* 95 86  BUN 26* 19 13  CREATININE 1.73* 1.03 0.88  CALCIUM 9.5 8.5* 8.0*   GFR: Estimated Creatinine Clearance: 139.2 mL/min (by C-G formula based on SCr of 0.88 mg/dL). Liver Function Tests: Recent Labs  Lab 03/20/23 1830 03/21/23 0344  03/22/23 0343  AST 61* 93* 219*  ALT 50* 43 53*  ALKPHOS 87 71 62  BILITOT 0.7 0.8 0.8  PROT 8.1 6.5 5.7*  ALBUMIN 4.7 3.8 3.2*   No results for input(s): "LIPASE", "AMYLASE" in the last 168 hours. No results for input(s): "AMMONIA" in the last 168 hours. Coagulation Profile: No results for input(s): "INR", "PROTIME" in the last 168 hours. Cardiac Enzymes: Recent Labs  Lab 03/20/23 1830 03/21/23 0344 03/22/23 0343  CKTOTAL 2,032* 4,647* 13,964*   BNP (last 3 results) No results for input(s): "PROBNP" in the last 8760  hours. HbA1C: No results for input(s): "HGBA1C" in the last 72 hours. CBG: Recent Labs  Lab 03/20/23 1743  GLUCAP 123*   Lipid Profile: No results for input(s): "CHOL", "HDL", "LDLCALC", "TRIG", "CHOLHDL", "LDLDIRECT" in the last 72 hours. Thyroid Function Tests: No results for input(s): "TSH", "T4TOTAL", "FREET4", "T3FREE", "THYROIDAB" in the last 72 hours. Anemia Panel: No results for input(s): "VITAMINB12", "FOLATE", "FERRITIN", "TIBC", "IRON", "RETICCTPCT" in the last 72 hours. Urine analysis:    Component Value Date/Time   COLORURINE YELLOW 03/20/2023 1946   APPEARANCEUR CLEAR 03/20/2023 1946   LABSPEC 1.020 03/20/2023 1946   PHURINE 5.0 03/20/2023 1946   GLUCOSEU NEGATIVE 03/20/2023 1946   HGBUR SMALL (A) 03/20/2023 1946   BILIRUBINUR NEGATIVE 03/20/2023 1946   KETONESUR NEGATIVE 03/20/2023 1946   PROTEINUR 100 (A) 03/20/2023 1946   NITRITE NEGATIVE 03/20/2023 1946   LEUKOCYTESUR NEGATIVE 03/20/2023 1946   Sepsis Labs: @LABRCNTIP (procalcitonin:4,lacticidven:4)  )No results found for this or any previous visit (from the past 240 hour(s)).    Studies: No results found.  Scheduled Meds:  Continuous Infusions:  sodium chloride 150 mL/hr at 03/22/23 0616     LOS: 1 day     Briant Cedar, MD [facility]  If 7PM-7AM, please contact night-coverage [email or web address] 03/22/2023, 3:48 PM

## 2023-03-23 DIAGNOSIS — M6282 Rhabdomyolysis: Secondary | ICD-10-CM

## 2023-03-23 DIAGNOSIS — N179 Acute kidney failure, unspecified: Secondary | ICD-10-CM | POA: Diagnosis not present

## 2023-03-23 DIAGNOSIS — R7989 Other specified abnormal findings of blood chemistry: Secondary | ICD-10-CM | POA: Insufficient documentation

## 2023-03-23 DIAGNOSIS — T679XXA Effect of heat and light, unspecified, initial encounter: Secondary | ICD-10-CM

## 2023-03-23 LAB — COMPREHENSIVE METABOLIC PANEL
ALT: 77 U/L — ABNORMAL HIGH (ref 0–44)
AST: 379 U/L — ABNORMAL HIGH (ref 15–41)
Albumin: 3.4 g/dL — ABNORMAL LOW (ref 3.5–5.0)
Alkaline Phosphatase: 64 U/L (ref 38–126)
Anion gap: 7 (ref 5–15)
BUN: 13 mg/dL (ref 6–20)
CO2: 25 mmol/L (ref 22–32)
Calcium: 8.5 mg/dL — ABNORMAL LOW (ref 8.9–10.3)
Chloride: 108 mmol/L (ref 98–111)
Creatinine, Ser: 0.95 mg/dL (ref 0.61–1.24)
GFR, Estimated: >60 mL/min (ref >60)
Glucose, Bld: 108 mg/dL — ABNORMAL HIGH (ref 70–99)
Potassium: 3.6 mmol/L (ref 3.5–5.1)
Sodium: 140 mmol/L (ref 135–145)
Total Bilirubin: 0.3 mg/dL (ref 0.3–1.2)
Total Protein: 6 g/dL — ABNORMAL LOW (ref 6.5–8.1)

## 2023-03-23 LAB — CK: Total CK: 30117 U/L — ABNORMAL HIGH (ref 49–397)

## 2023-03-23 MED ORDER — SODIUM CHLORIDE 0.9 % IV BOLUS
2000.0000 mL | Freq: Once | INTRAVENOUS | Status: AC
  Administered 2023-03-23: 2000 mL via INTRAVENOUS

## 2023-03-23 NOTE — Progress Notes (Signed)
[facility] PROGRESS NOTE    Progress Note  [name]  DGU:[id number] DOB: 05-18-1991 DOA: 03/20/2023 PCP: Patient, No Pcp Per     Brief Narrative:   Martin Elliott is an 32 y.o. 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  Nontraumatic rhabdomyolysis: CK continues to increase. Will give him 2 bolus of normal saline and continue normal saline, monitor strict I's and O's and daily weights.  Elevated LFT's: Likely due to elevated ck.  Hypokalemia: Repleted now improved.  Polysubstance abuse tobacco abuse: UDS was positive for amphetamines and cannabis. Advised to quit.  Obesity: Has been counseled about lifestyle modifications.   DVT prophylaxis: lovenox Family Communication:none Status is: Inpatient Remains inpatient appropriate because: Nontraumatic rhabdomyolysis with acute kidney injury    Code Status:     Code Status Orders  (From admission, onward)           Start     Ordered   03/20/23 2013  Full code  Continuous       Question:  By:  Answer:  Consent: discussion documented in EHR   03/20/23 2013           Code Status History     This patient has a current code status but no historical code status.         IV Access:   Peripheral IV   Procedures and diagnostic studies:   No results found.   Medical Consultants:   None.   Subjective:    [name] feels better tolerating his diet.  Objective:    Vitals:   03/22/23 0600 03/22/23 1400 03/22/23 2011 03/23/23 0404  BP: 120/76 133/84 132/83 (!) 122/94  Pulse: 68 (!) 53 (!) 52 (!) 51  Resp: 18 16 18 16   Temp: 98.2 F (36.8 C) 97.6 F (36.4 C) 98.4 F (36.9 C) 98.2 F (36.8 C)   TempSrc: Oral Oral Oral Oral  SpO2: 100% 100% 100% 95%  Weight:      Height:       SpO2: 95 % O2 Flow Rate (L/min): 3 L/min  No intake or output data in the 24 hours ending 03/23/23 1024 Filed Weights   03/20/23 1742  Weight: 96.2 kg    Exam: General exam: In no acute distress. Respiratory system: Good air movement and clear to auscultation. Cardiovascular system: S1 & S2 heard, RRR. No JVD. Gastrointestinal system: Abdomen is nondistended, soft and nontender.  Extremities: No pedal edema. Skin: No rashes, lesions or ulcers Psychiatry: Judgement and insight appear normal. Mood & affect appropriate.    Data Reviewed:    Labs: Basic Metabolic Panel: Recent Labs  Lab 03/20/23 1830 03/21/23 0344 03/22/23 0343 03/23/23 0402  NA 133* 137 139 140  K 3.7 3.4* 3.8 3.6  CL 102 108 112* 108  CO2 20* 20* 20* 25  GLUCOSE 137* 95 86 108*  BUN 26* 19 13 13   CREATININE 1.73* 1.03 0.88 0.95  CALCIUM 9.5 8.5* 8.0* 8.5*   GFR Estimated Creatinine Clearance: 128.9 mL/min (by C-G formula based on SCr of 0.95 mg/dL). Liver Function Tests: Recent Labs  Lab 03/20/23 1830 03/21/23 0344 03/22/23 0343 03/23/23 0402  AST 61* 93* 219* 379*  ALT 50* 43 53* 77*  ALKPHOS 87  71 62 64  BILITOT 0.7 0.8 0.8 0.3  PROT 8.1 6.5 5.7* 6.0*  ALBUMIN 4.7 3.8 3.2* 3.4*   No results for input(s): "LIPASE", "AMYLASE" in the last 168 hours. No results for input(s): "AMMONIA" in the last 168 hours. Coagulation profile No results for input(s): "INR", "PROTIME" in the last 168 hours. COVID-19 Labs  No results for input(s): "DDIMER", "FERRITIN", "LDH", "CRP" in the last 72 hours.  Lab Results  Component Value Date   SARSCOV2NAA NEGATIVE 08/03/2022   SARSCOV2NAA NEGATIVE 08/22/2020    CBC: Recent Labs  Lab 03/20/23 1830 03/21/23 0417  WBC 10.6* 10.4  NEUTROABS 8.1*  --   HGB 15.2 13.7  HCT 45.2 41.7  MCV 86.3 88.3  PLT 268 246   Cardiac Enzymes: Recent Labs  Lab 03/20/23 1830  03/21/23 0344 03/22/23 0343 03/23/23 0402  CKTOTAL 2,032* 4,647* 13,964* 30,117*   BNP (last 3 results) No results for input(s): "PROBNP" in the last 8760 hours. CBG: Recent Labs  Lab 03/20/23 1743  GLUCAP 123*   D-Dimer: No results for input(s): "DDIMER" in the last 72 hours. Hgb A1c: No results for input(s): "HGBA1C" in the last 72 hours. Lipid Profile: No results for input(s): "CHOL", "HDL", "LDLCALC", "TRIG", "CHOLHDL", "LDLDIRECT" in the last 72 hours. Thyroid function studies: No results for input(s): "TSH", "T4TOTAL", "T3FREE", "THYROIDAB" in the last 72 hours.  Invalid input(s): "FREET3" Anemia work up: No results for input(s): "VITAMINB12", "FOLATE", "FERRITIN", "TIBC", "IRON", "RETICCTPCT" in the last 72 hours. Sepsis Labs: Recent Labs  Lab 03/20/23 1830 03/21/23 0417  WBC 10.6* 10.4   Microbiology No results found for this or any previous visit (from the past 240 hour(s)).   Medications:    enoxaparin (LOVENOX) injection  40 mg Subcutaneous Daily   Continuous Infusions:  sodium chloride 150 mL/hr at 03/23/23 0852   sodium chloride        LOS: 2 days   Marinda Elk  [facility]  03/23/2023, 10:24 AM

## 2023-03-24 DIAGNOSIS — T679XXA Effect of heat and light, unspecified, initial encounter: Secondary | ICD-10-CM | POA: Diagnosis not present

## 2023-03-24 DIAGNOSIS — M6282 Rhabdomyolysis: Secondary | ICD-10-CM | POA: Diagnosis not present

## 2023-03-24 DIAGNOSIS — N179 Acute kidney failure, unspecified: Secondary | ICD-10-CM | POA: Diagnosis not present

## 2023-03-24 LAB — COMPREHENSIVE METABOLIC PANEL
ALT: 104 U/L — ABNORMAL HIGH (ref 0–44)
AST: 485 U/L — ABNORMAL HIGH (ref 15–41)
Albumin: 3.7 g/dL (ref 3.5–5.0)
Alkaline Phosphatase: 72 U/L (ref 38–126)
Anion gap: 7 (ref 5–15)
BUN: 14 mg/dL (ref 6–20)
CO2: 23 mmol/L (ref 22–32)
Calcium: 8.6 mg/dL — ABNORMAL LOW (ref 8.9–10.3)
Chloride: 107 mmol/L (ref 98–111)
Creatinine, Ser: 0.91 mg/dL (ref 0.61–1.24)
GFR, Estimated: >60 mL/min (ref >60)
Glucose, Bld: 83 mg/dL (ref 70–99)
Potassium: 3.7 mmol/L (ref 3.5–5.1)
Sodium: 137 mmol/L (ref 135–145)
Total Bilirubin: 0.4 mg/dL (ref 0.3–1.2)
Total Protein: 6.4 g/dL — ABNORMAL LOW (ref 6.5–8.1)

## 2023-03-24 LAB — CK: Total CK: 37582 U/L — ABNORMAL HIGH (ref 49–397)

## 2023-03-24 MED ORDER — SODIUM CHLORIDE 0.9 % IV BOLUS
2000.0000 mL | Freq: Once | INTRAVENOUS | Status: AC
  Administered 2023-03-24: 2000 mL via INTRAVENOUS

## 2023-03-24 NOTE — Progress Notes (Signed)
[facility] PROGRESS NOTE    Progress Note  ZAKAI GONYEA  ZOX:[id number] DOB: [birth year] DOA: 03/20/2023 PCP: Patient, No Pcp Per     Brief Narrative:   Martin Elliott is an 32 y.o. male no significant past medical history comes into the ED for severe muscle cramps bilaterally at work patient works [employer] outside in extreme heat was found to have a CK level 2000 and transaminitis.  Assessment/Plan:   AKI (acute kidney injury) (HCC) Likely pre- azotemia resolved with fluid resuscitation.  Nontraumatic rhabdomyolysis: CK continues to increase, renal function is preserved. Continue aggressive IV fluid hydration. Recheck CK in am.  Elevated LFT's: Likely due to elevated ck.  Hypokalemia: Repleted now improved.  Polysubstance abuse tobacco abuse: UDS was positive for amphetamines and cannabis. Advised to quit.  Obesity: Has been counseled about lifestyle modifications.   DVT prophylaxis: lovenox Family Communication:none Status is: Inpatient Remains inpatient appropriate because: Nontraumatic rhabdomyolysis with acute kidney injury    Code Status:     Code Status Orders  (From admission, onward)           Start     Ordered   03/20/23 2013  Full code  Continuous       Question:  By:  Answer:  Consent: discussion documented in EHR   03/20/23 2013           Code Status History     This patient has a current code status but no historical code status.         IV Access:   Peripheral IV   Procedures and diagnostic studies:   No results found.   Medical Consultants:   None.   Subjective:    [name] feels better no new complaints.  Objective:    Vitals:   03/23/23 0404 03/23/23 1326 03/23/23 1959 03/24/23 0406  BP: (!) 122/94 (!) 137/96 (!) 140/90 (!) 133/107  Pulse: (!) 51 (!) 53 (!) 56 62  Resp: 16 16 16 17   Temp: 98.2 F (36.8 C) 98.6 F (37 C) 98.5 F (36.9 C) 98.2 F (36.8 C)  TempSrc:  Oral  Oral Oral  SpO2: 95% 97% 100% 95%  Weight:      Height:       SpO2: 95 % O2 Flow Rate (L/min): 3 L/min   Intake/Output Summary (Last 24 hours) at 03/24/2023 1012 Last data filed at 03/24/2023 0550 Gross per 24 hour  Intake 7187.69 ml  Output 5825 ml  Net 1362.69 ml   Filed Weights   03/20/23 1742  Weight: 96.2 kg    Exam: General exam: In no acute distress. Respiratory system: Good air movement and clear to auscultation. Cardiovascular system: S1 & S2 heard, RRR. No JVD. Gastrointestinal system: Abdomen is nondistended, soft and nontender.  Extremities: No pedal edema. Skin: No rashes, lesions or ulcers Psychiatry: Judgement and insight appear normal. Mood & affect appropriate.  Data Reviewed:    Labs: Basic Metabolic Panel: Recent Labs  Lab 03/20/23 1830 03/21/23 0344 03/22/23 0343 03/23/23 0402 03/24/23 0359  NA 133* 137 139 140 137  K 3.7 3.4* 3.8 3.6 3.7  CL 102 108 112* 108 107  CO2 20* 20* 20* 25 23  GLUCOSE 137* 95 86 108* 83  BUN 26* 19 13 13 14   CREATININE 1.73* 1.03 0.88 0.95 0.91  CALCIUM 9.5 8.5* 8.0* 8.5* 8.6*    GFR Estimated Creatinine Clearance: 134.6 mL/min (by C-G formula based on SCr of 0.91 mg/dL). Liver Function Tests:  Recent Labs  Lab 03/20/23 1830 03/21/23 0344 03/22/23 0343 03/23/23 0402 03/24/23 0359  AST 61* 93* 219* 379* 485*  ALT 50* 43 53* 77* 104*  ALKPHOS 87 71 62 64 72  BILITOT 0.7 0.8 0.8 0.3 0.4  PROT 8.1 6.5 5.7* 6.0* 6.4*  ALBUMIN 4.7 3.8 3.2* 3.4* 3.7    No results for input(s): "LIPASE", "AMYLASE" in the last 168 hours. No results for input(s): "AMMONIA" in the last 168 hours. Coagulation profile No results for input(s): "INR", "PROTIME" in the last 168 hours. COVID-19 Labs  No results for input(s): "DDIMER", "FERRITIN", "LDH", "CRP" in the last 72 hours.  Lab Results  Component Value Date   SARSCOV2NAA NEGATIVE 08/03/2022   SARSCOV2NAA NEGATIVE 08/22/2020    CBC: Recent Labs  Lab  03/20/23 1830 03/21/23 0417  WBC 10.6* 10.4  NEUTROABS 8.1*  --   HGB 15.2 13.7  HCT 45.2 41.7  MCV 86.3 88.3  PLT 268 246    Cardiac Enzymes: Recent Labs  Lab 03/20/23 1830 03/21/23 0344 03/22/23 0343 03/23/23 0402 03/24/23 0359  CKTOTAL 2,032* 4,647* 13,964* 30,117* 37,582*    BNP (last 3 results) No results for input(s): "PROBNP" in the last 8760 hours. CBG: Recent Labs  Lab 03/20/23 1743  GLUCAP 123*    D-Dimer: No results for input(s): "DDIMER" in the last 72 hours. Hgb A1c: No results for input(s): "HGBA1C" in the last 72 hours. Lipid Profile: No results for input(s): "CHOL", "HDL", "LDLCALC", "TRIG", "CHOLHDL", "LDLDIRECT" in the last 72 hours. Thyroid function studies: No results for input(s): "TSH", "T4TOTAL", "T3FREE", "THYROIDAB" in the last 72 hours.  Invalid input(s): "FREET3" Anemia work up: No results for input(s): "VITAMINB12", "FOLATE", "FERRITIN", "TIBC", "IRON", "RETICCTPCT" in the last 72 hours. Sepsis Labs: Recent Labs  Lab 03/20/23 1830 03/21/23 0417  WBC 10.6* 10.4    Microbiology No results found for this or any previous visit (from the past 240 hour(s)).   Medications:    enoxaparin (LOVENOX) injection  40 mg Subcutaneous Daily   Continuous Infusions:  sodium chloride 150 mL/hr at 03/24/23 0559      LOS: 3 days   Marinda Elk  [facility]  03/24/2023, 10:12 AM

## 2023-03-25 LAB — COMPREHENSIVE METABOLIC PANEL
ALT: 111 U/L — ABNORMAL HIGH (ref 0–44)
AST: 374 U/L — ABNORMAL HIGH (ref 15–41)
Albumin: 3.5 g/dL (ref 3.5–5.0)
Alkaline Phosphatase: 64 U/L (ref 38–126)
Anion gap: 7 (ref 5–15)
BUN: 13 mg/dL (ref 6–20)
CO2: 23 mmol/L (ref 22–32)
Calcium: 8.5 mg/dL — ABNORMAL LOW (ref 8.9–10.3)
Chloride: 108 mmol/L (ref 98–111)
Creatinine, Ser: 0.86 mg/dL (ref 0.61–1.24)
GFR, Estimated: >60 mL/min (ref >60)
Glucose, Bld: 97 mg/dL (ref 70–99)
Potassium: 3.4 mmol/L — ABNORMAL LOW (ref 3.5–5.1)
Sodium: 138 mmol/L (ref 135–145)
Total Bilirubin: 0.5 mg/dL (ref 0.3–1.2)
Total Protein: 6.3 g/dL — ABNORMAL LOW (ref 6.5–8.1)

## 2023-03-25 LAB — CK: Total CK: 28351 U/L — ABNORMAL HIGH (ref 49–397)

## 2023-03-25 MED ORDER — SODIUM CHLORIDE 0.9 % IV BOLUS
2000.0000 mL | Freq: Once | INTRAVENOUS | Status: AC
  Administered 2023-03-25: 2000 mL via INTRAVENOUS

## 2023-03-25 NOTE — Plan of Care (Signed)
  Problem: Education: Goal: Knowledge of General Education information will improve Description Including pain rating scale, medication(s)/side effects and non-pharmacologic comfort measures Outcome: Progressing   Problem: Health Behavior/Discharge Planning: Goal: Ability to manage health-related needs will improve Outcome: Progressing   

## 2023-03-25 NOTE — Progress Notes (Signed)
[facility] PROGRESS NOTE    Progress Note  Martin Elliott  ZOX:[id number] DOB: 05-07-1991 DOA: 03/20/2023 PCP: Patient, No Pcp Per     Brief Narrative:   Martin Elliott is an 32 y.o. male no significant past medical history comes into the ED for severe muscle cramps bilaterally at work patient works [employer] outside in extreme heat was found to have a CK level 2000 and transaminitis.  Assessment/Plan:   AKI (acute kidney injury) (HCC) Likely pre- azotemia resolved with fluid resuscitation.  Nontraumatic rhabdomyolysis: Now improving continue IV fluids. Continue aggressive IV fluid hydration. Recheck CK in am.  Elevated LFT's: Likely due to elevated ck.  Hypokalemia: Repleted now improved.  Polysubstance abuse tobacco abuse: UDS was positive for amphetamines and cannabis. Advised to quit.  Obesity: Has been counseled about lifestyle modifications.   DVT prophylaxis: lovenox Family Communication:none Status is: Inpatient Remains inpatient appropriate because: Nontraumatic rhabdomyolysis with acute kidney injury    Code Status:     Code Status Orders  (From admission, onward)           Start     Ordered   03/20/23 2013  Full code  Continuous       Question:  By:  Answer:  Consent: discussion documented in EHR   03/20/23 2013           Code Status History     This patient has a current code status but no historical code status.         IV Access:   Peripheral IV   Procedures and diagnostic studies:   No results found.   Medical Consultants:   None.   Subjective:    Martin Elliott no comp lines  Objective:    Vitals:   03/23/23 1959 03/24/23 0406 03/24/23 2039 03/25/23 0534  BP: (!) 140/90 (!) 133/107 (!) 143/79 124/80  Pulse:  62 68 (!) 51  Resp: 16 17 17 17   Temp: 98.5 F (36.9 C) 98.2 F (36.8 C) 97.9 F (36.6 C) 98.3 F (36.8 C)  TempSrc: Oral Oral Oral Oral  SpO2:  95% 100% 100%  Weight:       Height:       SpO2: 100 % O2 Flow Rate (L/min): 3 L/min   Intake/Output Summary (Last 24 hours) at 03/25/2023 1010 Last data filed at 03/25/2023 0600 Gross per 24 hour  Intake 3541.96 ml  Output 7900 ml  Net -4358.04 ml    Filed Weights   03/20/23 1742  Weight: 96.2 kg    Exam: General exam: In no acute distress. Respiratory system: Good air movement and clear to auscultation. Cardiovascular system: S1 & S2 heard, RRR. No JVD. Gastrointestinal system: Abdomen is nondistended, soft and nontender.  Extremities: No pedal edema. Skin: No rashes, lesions or ulcers Psychiatry: Judgement and insight appear normal. Mood & affect appropriate.  Data Reviewed:    Labs: Basic Metabolic Panel: Recent Labs  Lab 03/21/23 0344 03/22/23 0343 03/23/23 0402 03/24/23 0359 03/25/23 0341  NA 137 139 140 137 138  K 3.4* 3.8 3.6 3.7 3.4*  CL 108 112* 108 107 108  CO2 20* 20* 25 23 23   GLUCOSE 95 86 108* 83 97  BUN 19 13 13 14 13   CREATININE 1.03 0.88 0.95 0.91 0.86  CALCIUM 8.5* 8.0* 8.5* 8.6* 8.5*    GFR Estimated Creatinine Clearance: 142.4 mL/min (by C-G formula based on SCr of 0.86 mg/dL). Liver Function Tests: Recent Labs  Lab 03/21/23 0344 03/22/23  1610 03/23/23 0402 03/24/23 0359 03/25/23 0341  AST 93* 219* 379* 485* 374*  ALT 43 53* 77* 104* 111*  ALKPHOS 71 62 64 72 64  BILITOT 0.8 0.8 0.3 0.4 0.5  PROT 6.5 5.7* 6.0* 6.4* 6.3*  ALBUMIN 3.8 3.2* 3.4* 3.7 3.5    No results for input(s): "LIPASE", "AMYLASE" in the last 168 hours. No results for input(s): "AMMONIA" in the last 168 hours. Coagulation profile No results for input(s): "INR", "PROTIME" in the last 168 hours. COVID-19 Labs  No results for input(s): "DDIMER", "FERRITIN", "LDH", "CRP" in the last 72 hours.  Lab Results  Component Value Date   SARSCOV2NAA NEGATIVE 08/03/2022   SARSCOV2NAA NEGATIVE 08/22/2020    CBC: Recent Labs  Lab 03/20/23 1830 03/21/23 0417  WBC 10.6* 10.4  NEUTROABS  8.1*  --   HGB 15.2 13.7  HCT 45.2 41.7  MCV 86.3 88.3  PLT 268 246    Cardiac Enzymes: Recent Labs  Lab 03/21/23 0344 03/22/23 0343 03/23/23 0402 03/24/23 0359 03/25/23 0341  CKTOTAL 4,647* 13,964* 30,117* 37,582* 28,351*    BNP (last 3 results) No results for input(s): "PROBNP" in the last 8760 hours. CBG: Recent Labs  Lab 03/20/23 1743  GLUCAP 123*    D-Dimer: No results for input(s): "DDIMER" in the last 72 hours. Hgb A1c: No results for input(s): "HGBA1C" in the last 72 hours. Lipid Profile: No results for input(s): "CHOL", "HDL", "LDLCALC", "TRIG", "CHOLHDL", "LDLDIRECT" in the last 72 hours. Thyroid function studies: No results for input(s): "TSH", "T4TOTAL", "T3FREE", "THYROIDAB" in the last 72 hours.  Invalid input(s): "FREET3" Anemia work up: No results for input(s): "VITAMINB12", "FOLATE", "FERRITIN", "TIBC", "IRON", "RETICCTPCT" in the last 72 hours. Sepsis Labs: Recent Labs  Lab 03/20/23 1830 03/21/23 0417  WBC 10.6* 10.4    Microbiology No results found for this or any previous visit (from the past 240 hour(s)).   Medications:    enoxaparin (LOVENOX) injection  40 mg Subcutaneous Daily   Continuous Infusions:  sodium chloride 150 mL/hr at 03/25/23 9604   sodium chloride        LOS: 4 days   Marinda Elk  [facility]  03/25/2023, 10:10 AM

## 2023-03-26 LAB — COMPREHENSIVE METABOLIC PANEL
ALT: 108 U/L — ABNORMAL HIGH (ref 0–44)
AST: 233 U/L — ABNORMAL HIGH (ref 15–41)
Albumin: 3.4 g/dL — ABNORMAL LOW (ref 3.5–5.0)
Alkaline Phosphatase: 71 U/L (ref 38–126)
Anion gap: 6 (ref 5–15)
BUN: 11 mg/dL (ref 6–20)
CO2: 26 mmol/L (ref 22–32)
Calcium: 8.5 mg/dL — ABNORMAL LOW (ref 8.9–10.3)
Chloride: 104 mmol/L (ref 98–111)
Creatinine, Ser: 0.87 mg/dL (ref 0.61–1.24)
GFR, Estimated: >60 mL/min (ref >60)
Glucose, Bld: 126 mg/dL — ABNORMAL HIGH (ref 70–99)
Potassium: 3.4 mmol/L — ABNORMAL LOW (ref 3.5–5.1)
Sodium: 136 mmol/L (ref 135–145)
Total Bilirubin: 0.4 mg/dL (ref 0.3–1.2)
Total Protein: 6 g/dL — ABNORMAL LOW (ref 6.5–8.1)

## 2023-03-26 LAB — CK: Total CK: 15691 U/L — ABNORMAL HIGH (ref 49–397)

## 2023-03-26 MED ORDER — POTASSIUM CHLORIDE CRYS ER 20 MEQ PO TBCR
40.0000 meq | EXTENDED_RELEASE_TABLET | Freq: Two times a day (BID) | ORAL | Status: AC
  Administered 2023-03-26 (×2): 40 meq via ORAL
  Filled 2023-03-26 (×2): qty 2

## 2023-03-26 MED ORDER — SODIUM CHLORIDE 0.9 % IV BOLUS
2000.0000 mL | Freq: Once | INTRAVENOUS | Status: AC
  Administered 2023-03-26: 2000 mL via INTRAVENOUS

## 2023-03-26 NOTE — Progress Notes (Signed)
[facility] PROGRESS NOTE    Progress Note  Martin Elliott  JYN:[id number] DOB: 12-Oct-1990 DOA: 03/20/2023 PCP: Patient, No Pcp Per     Brief Narrative:   Martin Elliott is an 32 y.o. male no significant past medical history comes into the ED for severe muscle cramps bilaterally at work patient works [employer] outside in extreme heat was found to have a CK level 2000 and transaminitis.  Assessment/Plan:   AKI (acute kidney injury) (HCC): Likely pre- azotemia resolved with fluid resuscitation.  Nontraumatic rhabdomyolysis: Continues to improve with IV fluids. Continue aggressive IV fluid resuscitation. Recheck CK in am.  Elevated LFT's: Likely due to elevated ck.  Hypokalemia: Replete orally.  Polysubstance abuse tobacco abuse: UDS was positive for amphetamines and cannabis. Advised to quit.  Obesity: Has been counseled about lifestyle modifications.   DVT prophylaxis: lovenox Family Communication:none Status is: Inpatient Remains inpatient appropriate because: Nontraumatic rhabdomyolysis with acute kidney injury    Code Status:     Code Status Orders  (From admission, onward)           Start     Ordered   03/20/23 2013  Full code  Continuous       Question:  By:  Answer:  Consent: discussion documented in EHR   03/20/23 2013           Code Status History     This patient has a current code status but no historical code status.         IV Access:   Peripheral IV   Procedures and diagnostic studies:   No results found.   Medical Consultants:   None.   Subjective:    Martin Elliott no complaints  Objective:    Vitals:   03/24/23 2039 03/25/23 0534 03/25/23 2104 03/26/23 0536  BP: (!) 143/79 124/80 (!) 140/90 123/87  Pulse: 68 (!) 51 61 72  Resp: 17 17 16 17   Temp: 97.9 F (36.6 C) 98.3 F (36.8 C) 99 F (37.2 C) 98 F (36.7 C)  TempSrc: Oral Oral    SpO2: 100% 100% 100% 100%  Weight:       Height:       SpO2: 100 % O2 Flow Rate (L/min): 3 L/min   Intake/Output Summary (Last 24 hours) at 03/26/2023 0900 Last data filed at 03/26/2023 0741 Gross per 24 hour  Intake 3565.5 ml  Output 6850 ml  Net -3284.5 ml    Filed Weights   03/20/23 1742  Weight: 96.2 kg    Exam: General exam: In no acute distress. Respiratory system: Good air movement and clear to auscultation. Cardiovascular system: S1 & S2 heard, RRR. No JVD. Gastrointestinal system: Abdomen is nondistended, soft and nontender.  Extremities: No pedal edema. Skin: No rashes, lesions or ulcers Psychiatry: Judgement and insight appear normal. Mood & affect appropriate. Data Reviewed:    Labs: Basic Metabolic Panel: Recent Labs  Lab 03/22/23 0343 03/23/23 0402 03/24/23 0359 03/25/23 0341 03/26/23 0429  NA 139 140 137 138 136  K 3.8 3.6 3.7 3.4* 3.4*  CL 112* 108 107 108 104  CO2 20* 25 23 23 26   GLUCOSE 86 108* 83 97 126*  BUN 13 13 14 13 11   CREATININE 0.88 0.95 0.91 0.86 0.87  CALCIUM 8.0* 8.5* 8.6* 8.5* 8.5*    GFR Estimated Creatinine Clearance: 140.8 mL/min (by C-G formula based on SCr of 0.87 mg/dL). Liver Function Tests: Recent Labs  Lab 03/22/23 8657 03/23/23 0402 03/24/23 0359  03/25/23 0341 03/26/23 0429  AST 219* 379* 485* 374* 233*  ALT 53* 77* 104* 111* 108*  ALKPHOS 62 64 72 64 71  BILITOT 0.8 0.3 0.4 0.5 0.4  PROT 5.7* 6.0* 6.4* 6.3* 6.0*  ALBUMIN 3.2* 3.4* 3.7 3.5 3.4*    No results for input(s): "LIPASE", "AMYLASE" in the last 168 hours. No results for input(s): "AMMONIA" in the last 168 hours. Coagulation profile No results for input(s): "INR", "PROTIME" in the last 168 hours. COVID-19 Labs  No results for input(s): "DDIMER", "FERRITIN", "LDH", "CRP" in the last 72 hours.  Lab Results  Component Value Date   SARSCOV2NAA NEGATIVE 08/03/2022   SARSCOV2NAA NEGATIVE 08/22/2020    CBC: Recent Labs  Lab 03/20/23 1830 03/21/23 0417  WBC 10.6* 10.4  NEUTROABS  8.1*  --   HGB 15.2 13.7  HCT 45.2 41.7  MCV 86.3 88.3  PLT 268 246    Cardiac Enzymes: Recent Labs  Lab 03/22/23 0343 03/23/23 0402 03/24/23 0359 03/25/23 0341 03/26/23 0429  CKTOTAL 13,964* 30,117* 37,582* 28,351* 15,691*    BNP (last 3 results) No results for input(s): "PROBNP" in the last 8760 hours. CBG: Recent Labs  Lab 03/20/23 1743  GLUCAP 123*    D-Dimer: No results for input(s): "DDIMER" in the last 72 hours. Hgb A1c: No results for input(s): "HGBA1C" in the last 72 hours. Lipid Profile: No results for input(s): "CHOL", "HDL", "LDLCALC", "TRIG", "CHOLHDL", "LDLDIRECT" in the last 72 hours. Thyroid function studies: No results for input(s): "TSH", "T4TOTAL", "T3FREE", "THYROIDAB" in the last 72 hours.  Invalid input(s): "FREET3" Anemia work up: No results for input(s): "VITAMINB12", "FOLATE", "FERRITIN", "TIBC", "IRON", "RETICCTPCT" in the last 72 hours. Sepsis Labs: Recent Labs  Lab 03/20/23 1830 03/21/23 0417  WBC 10.6* 10.4    Microbiology No results found for this or any previous visit (from the past 240 hour(s)).   Medications:    enoxaparin (LOVENOX) injection  40 mg Subcutaneous Daily   Continuous Infusions:  sodium chloride 150 mL/hr at 03/26/23 0200      LOS: 5 days   Marinda Elk  [facility]  03/26/2023, 9:00 AM

## 2023-03-27 LAB — CK: Total CK: 7448 U/L — ABNORMAL HIGH (ref 49–397)

## 2023-03-27 MED ORDER — SODIUM CHLORIDE 0.9 % IV BOLUS
2000.0000 mL | Freq: Once | INTRAVENOUS | Status: AC
  Administered 2023-03-27: 2000 mL via INTRAVENOUS

## 2023-03-27 MED ORDER — POTASSIUM CHLORIDE CRYS ER 20 MEQ PO TBCR
40.0000 meq | EXTENDED_RELEASE_TABLET | Freq: Two times a day (BID) | ORAL | Status: AC
  Administered 2023-03-27 (×2): 40 meq via ORAL
  Filled 2023-03-27 (×2): qty 2

## 2023-03-27 NOTE — Progress Notes (Signed)
[facility] PROGRESS NOTE    Progress Note  Martin Elliott  ZOX:[id number] DOB: 1991/08/10 DOA: 03/20/2023 PCP: Patient, No Pcp Per     Brief Narrative:   Martin Elliott is an 32 y.o. male no significant past medical history comes into the ED for severe muscle cramps bilaterally at work patient works [employer] outside in extreme heat was found to have a CK level 2000 and transaminitis.  Assessment/Plan:   AKI (acute kidney injury) (HCC): Likely pre- azotemia resolved with fluid resuscitation.  Nontraumatic rhabdomyolysis: CK continues to be elevated but improving. Continue aggressive IV fluid resuscitation. Recheck CK in am.  Elevated LFT's: Likely due to elevated ck.  Hypokalemia: Replete orally.  Basic metabolic panels pending this morning.  Polysubstance abuse tobacco abuse: UDS was positive for amphetamines and cannabis. Advised to quit.  Obesity: Has been counseled about lifestyle modifications.   DVT prophylaxis: lovenox Family Communication:none Status is: Inpatient Remains inpatient appropriate because: Nontraumatic rhabdomyolysis with acute kidney injury    Code Status:     Code Status Orders  (From admission, onward)           Start     Ordered   03/20/23 2013  Full code  Continuous       Question:  By:  Answer:  Consent: discussion documented in EHR   03/20/23 2013           Code Status History     This patient has a current code status but no historical code status.         IV Access:   Peripheral IV   Procedures and diagnostic studies:   No results found.   Medical Consultants:   None.   Subjective:    Martin Elliott no complaints  Objective:    Vitals:   03/26/23 0536 03/26/23 1222 03/26/23 2053 03/27/23 0532  BP: 123/87 (!) 136/92 (!) 153/79 118/80  Pulse: 72 (!) 50 62 (!) 46  Resp: 17 16 16 17   Temp: 98 F (36.7 C) 98.4 F (36.9 C) 98.2 F (36.8 C) 97.7 F (36.5 C)  TempSrc:   Oral Oral Oral  SpO2: 100% 100% 98% 100%  Weight:      Height:       SpO2: 100 % O2 Flow Rate (L/min): 3 L/min   Intake/Output Summary (Last 24 hours) at 03/27/2023 0955 Last data filed at 03/26/2023 1500 Gross per 24 hour  Intake 500 ml  Output 2300 ml  Net -1800 ml    Filed Weights   03/20/23 1742  Weight: 96.2 kg    Exam: General exam: In no acute distress. Respiratory system: Good air movement and clear to auscultation. Cardiovascular system: S1 & S2 heard, RRR. No JVD. Gastrointestinal system: Abdomen is nondistended, soft and nontender.  Extremities: No pedal edema. Skin: No rashes, lesions or ulcers Psychiatry: Judgement and insight appear normal. Mood & affect appropriate. Data Reviewed:    Labs: Basic Metabolic Panel: Recent Labs  Lab 03/22/23 0343 03/23/23 0402 03/24/23 0359 03/25/23 0341 03/26/23 0429  NA 139 140 137 138 136  K 3.8 3.6 3.7 3.4* 3.4*  CL 112* 108 107 108 104  CO2 20* 25 23 23 26   GLUCOSE 86 108* 83 97 126*  BUN 13 13 14 13 11   CREATININE 0.88 0.95 0.91 0.86 0.87  CALCIUM 8.0* 8.5* 8.6* 8.5* 8.5*    GFR Estimated Creatinine Clearance: 140.8 mL/min (by C-G formula based on SCr of 0.87 mg/dL). Liver Function Tests: Recent  Labs  Lab 03/22/23 0343 03/23/23 0402 03/24/23 0359 03/25/23 0341 03/26/23 0429  AST 219* 379* 485* 374* 233*  ALT 53* 77* 104* 111* 108*  ALKPHOS 62 64 72 64 71  BILITOT 0.8 0.3 0.4 0.5 0.4  PROT 5.7* 6.0* 6.4* 6.3* 6.0*  ALBUMIN 3.2* 3.4* 3.7 3.5 3.4*    No results for input(s): "LIPASE", "AMYLASE" in the last 168 hours. No results for input(s): "AMMONIA" in the last 168 hours. Coagulation profile No results for input(s): "INR", "PROTIME" in the last 168 hours. COVID-19 Labs  No results for input(s): "DDIMER", "FERRITIN", "LDH", "CRP" in the last 72 hours.  Lab Results  Component Value Date   SARSCOV2NAA NEGATIVE 08/03/2022   SARSCOV2NAA NEGATIVE 08/22/2020    CBC: Recent Labs  Lab  03/20/23 1830 03/21/23 0417  WBC 10.6* 10.4  NEUTROABS 8.1*  --   HGB 15.2 13.7  HCT 45.2 41.7  MCV 86.3 88.3  PLT 268 246    Cardiac Enzymes: Recent Labs  Lab 03/23/23 0402 03/24/23 0359 03/25/23 0341 03/26/23 0429 03/27/23 0347  CKTOTAL 30,117* 37,582* 28,351* 15,691* 7,448*    BNP (last 3 results) No results for input(s): "PROBNP" in the last 8760 hours. CBG: Recent Labs  Lab 03/20/23 1743  GLUCAP 123*    D-Dimer: No results for input(s): "DDIMER" in the last 72 hours. Hgb A1c: No results for input(s): "HGBA1C" in the last 72 hours. Lipid Profile: No results for input(s): "CHOL", "HDL", "LDLCALC", "TRIG", "CHOLHDL", "LDLDIRECT" in the last 72 hours. Thyroid function studies: No results for input(s): "TSH", "T4TOTAL", "T3FREE", "THYROIDAB" in the last 72 hours.  Invalid input(s): "FREET3" Anemia work up: No results for input(s): "VITAMINB12", "FOLATE", "FERRITIN", "TIBC", "IRON", "RETICCTPCT" in the last 72 hours. Sepsis Labs: Recent Labs  Lab 03/20/23 1830 03/21/23 0417  WBC 10.6* 10.4    Microbiology No results found for this or any previous visit (from the past 240 hour(s)).   Medications:    enoxaparin (LOVENOX) injection  40 mg Subcutaneous Daily   Continuous Infusions:  sodium chloride 150 mL/hr at 03/27/23 0709   sodium chloride        LOS: 6 days   Marinda Elk  [facility]  03/27/2023, 9:55 AM

## 2023-03-27 NOTE — Plan of Care (Signed)

## 2023-03-28 LAB — BASIC METABOLIC PANEL
Anion gap: 8 (ref 5–15)
BUN: 11 mg/dL (ref 6–20)
CO2: 26 mmol/L (ref 22–32)
Calcium: 8.9 mg/dL (ref 8.9–10.3)
Chloride: 107 mmol/L (ref 98–111)
Creatinine, Ser: 0.93 mg/dL (ref 0.61–1.24)
GFR, Estimated: >60 mL/min (ref >60)
Glucose, Bld: 123 mg/dL — ABNORMAL HIGH (ref 70–99)
Potassium: 3.7 mmol/L (ref 3.5–5.1)
Sodium: 141 mmol/L (ref 135–145)

## 2023-03-28 LAB — CK: Total CK: 3995 U/L — ABNORMAL HIGH (ref 49–397)

## 2023-03-28 MED ORDER — POTASSIUM CHLORIDE CRYS ER 20 MEQ PO TBCR
40.0000 meq | EXTENDED_RELEASE_TABLET | Freq: Two times a day (BID) | ORAL | Status: DC
  Administered 2023-03-28: 40 meq via ORAL
  Filled 2023-03-28: qty 2

## 2023-03-28 MED ORDER — SODIUM CHLORIDE 0.9 % IV BOLUS
2000.0000 mL | Freq: Once | INTRAVENOUS | Status: AC
  Administered 2023-03-28: 2000 mL via INTRAVENOUS

## 2023-03-28 NOTE — Plan of Care (Signed)

## 2023-03-28 NOTE — Discharge Summary (Signed)
Physician Discharge Summary  Martin Elliott ZOX:[id number] DOB: 1991-03-13 DOA: 03/20/2023  PCP: Patient, No Pcp Per  Admit date: 03/20/2023 Discharge date: 03/28/2023  Admitted From: Home Disposition:  Home  Recommendations for Outpatient Follow-up:  Follow up with PCP in 1-2 weeks Please obtain BMP/CBC in one week Please follow up on the following pending results:  Home Health:No Equipment/Devices:None  Discharge Condition:Stable CODE STATUS:Full Diet recommendation: Heart Healthy  Brief/Interim Summary: 32 y.o. male no significant past medical history comes into the ED for severe muscle cramps bilaterally at work patient works [employer] outside in extreme heat was found to have a CK level 2000 and transaminitis.   Discharge Diagnoses:  Principal Problem:   AKI (acute kidney injury) (HCC) Active Problems:   Rhabdomyolysis   Elevated LFTs  Acute kidney injury: Likely prerenal azotemia resolved with IV fluid resuscitation.  Nontraumatic rhabdomyolysis: Likely due to working out in extreme heat and amphetamine use, he was started on aggressive IV fluid resuscitation, I peaked at 37,000, it  took several days for a CK to get below 4000. No continue to drink fluids as an outpatient.  Elevated LFTs: Likely due to elevated CK. Now resolved.  Polysubstance abuse: UDS was positive for amphetamine cannabis He has been counseled.  Obesity: Noted.  Discharge Instructions  Discharge Instructions     Diet - low sodium heart healthy   Complete by: As directed    Increase activity slowly   Complete by: As directed    No wound care   Complete by: As directed       Allergies as of 03/28/2023   No Known Allergies      Medication List     TAKE these medications    acetaminophen 500 MG tablet Commonly known as: TYLENOL Take 1,000 mg by mouth every 6 (six) hours as needed for moderate pain.   ibuprofen 800 MG tablet Commonly known as: ADVIL Take 1  tablet (800 mg total) by mouth 3 (three) times daily.        Follow-up Information     [facility]. Go on 03/30/2023.   Specialty: Internal Medicine Why: You have an appointment on Wednesday, 03/30/2023 at 2:20pm. Please arrive 15 minutes early to complete new patient paperwork. Contact information: [address] [place] [address] [phone] mc [place] [address] [phone]               No Known Allergies  Consultations:   Procedures/Studies: DG Chest Port 1 View  Result Date: 03/20/2023 CLINICAL DATA:  Acute onset muscle cramps and diaphoresis EXAM: PORTABLE CHEST 1 VIEW COMPARISON:  Chest radiograph dated 08/03/2022 FINDINGS: Normal lung volumes. No focal consolidations. No pleural effusion or pneumothorax. The heart size and mediastinal contours are within normal limits. No acute osseous abnormality. IMPRESSION: No active disease. Electronically Signed   By: Agustin Cree M.D.   On: 03/20/2023 18:39   (Echo, Carotid, EGD, Colonoscopy, ERCP)    Subjective: No complaints Discharge Exam: Vitals:   03/27/23 1947 03/28/23 0610  BP: 136/89 (!) 143/92  Pulse: (!) 52 60  Resp: 18 18  Temp: 98.6 F (37 C) 98.2 F (36.8 C)  SpO2: 98% 94%   Vitals:   03/27/23 0532 03/27/23 1403 03/27/23 1947 03/28/23 0610  BP: 118/80 (!) 143/89 136/89 (!) 143/92  Pulse: (!) 46 (!) 54 (!) 52 60  Resp: 17 14 18 18   Temp: 97.7 F (36.5 C) (!) 97.5 F (36.4 C) 98.6 F (37 C) 98.2 F (36.8  C)  TempSrc: Oral   Oral  SpO2: 100% 98% 98% 94%  Weight:      Height:        General: Pt is alert, awake, not in acute distress Cardiovascular: RRR, S1/S2 +, no rubs, no gallops Respiratory: CTA bilaterally, no wheezing, no rhonchi Abdominal: Soft, NT, ND, bowel sounds + Extremities: no edema, no cyanosis    The results of significant diagnostics from this hospitalization (including imaging, microbiology, ancillary and laboratory) are listed below for reference.      Microbiology: No results found for this or any previous visit (from the past 240 hour(s)).   Labs: BNP (last 3 results) No results for input(s): "BNP" in the last 8760 hours. Basic Metabolic Panel: Recent Labs  Lab 03/23/23 0402 03/24/23 0359 03/25/23 0341 03/26/23 0429 03/28/23 0355  NA 140 137 138 136 141  K 3.6 3.7 3.4* 3.4* 3.7  CL 108 107 108 104 107  CO2 25 23 23 26 26   GLUCOSE 108* 83 97 126* 123*  BUN 13 14 13 11 11   CREATININE 0.95 0.91 0.86 0.87 0.93  CALCIUM 8.5* 8.6* 8.5* 8.5* 8.9   Liver Function Tests: Recent Labs  Lab 03/22/23 0343 03/23/23 0402 03/24/23 0359 03/25/23 0341 03/26/23 0429  AST 219* 379* 485* 374* 233*  ALT 53* 77* 104* 111* 108*  ALKPHOS 62 64 72 64 71  BILITOT 0.8 0.3 0.4 0.5 0.4  PROT 5.7* 6.0* 6.4* 6.3* 6.0*  ALBUMIN 3.2* 3.4* 3.7 3.5 3.4*   No results for input(s): "LIPASE", "AMYLASE" in the last 168 hours. No results for input(s): "AMMONIA" in the last 168 hours. CBC: No results for input(s): "WBC", "NEUTROABS", "HGB", "HCT", "MCV", "PLT" in the last 168 hours. Cardiac Enzymes: Recent Labs  Lab 03/24/23 0359 03/25/23 0341 03/26/23 0429 03/27/23 0347 03/28/23 0355  CKTOTAL 37,582* 28,351* 15,691* 7,448* 3,995*   BNP: Invalid input(s): "POCBNP" CBG: No results for input(s): "GLUCAP" in the last 168 hours. D-Dimer No results for input(s): "DDIMER" in the last 72 hours. Hgb A1c No results for input(s): "HGBA1C" in the last 72 hours. Lipid Profile No results for input(s): "CHOL", "HDL", "LDLCALC", "TRIG", "CHOLHDL", "LDLDIRECT" in the last 72 hours. Thyroid function studies No results for input(s): "TSH", "T4TOTAL", "T3FREE", "THYROIDAB" in the last 72 hours.  Invalid input(s): "FREET3" Anemia work up No results for input(s): "VITAMINB12", "FOLATE", "FERRITIN", "TIBC", "IRON", "RETICCTPCT" in the last 72 hours. Urinalysis    Component Value Date/Time   COLORURINE YELLOW 03/20/2023 1946   APPEARANCEUR CLEAR  03/20/2023 1946   LABSPEC 1.020 03/20/2023 1946   PHURINE 5.0 03/20/2023 1946   GLUCOSEU NEGATIVE 03/20/2023 1946   HGBUR SMALL (A) 03/20/2023 1946   BILIRUBINUR NEGATIVE 03/20/2023 1946   KETONESUR NEGATIVE 03/20/2023 1946   PROTEINUR 100 (A) 03/20/2023 1946   NITRITE NEGATIVE 03/20/2023 1946   LEUKOCYTESUR NEGATIVE 03/20/2023 1946   Sepsis Labs No results for input(s): "WBC" in the last 168 hours.  Invalid input(s): "PROCALCITONIN", "LACTICIDVEN" Microbiology No results found for this or any previous visit (from the past 240 hour(s)).   Time coordinating discharge: Over 30 minutes  SIGNED:   Marinda Elk, MD  [facility] 03/28/2023, 8:06 AM Pager   If 7PM-7AM, please contact night-coverage [email or web address] Password [email or web address]

## 2023-03-30 ENCOUNTER — Inpatient Hospital Stay: Payer: Self-pay | Admitting: Nurse Practitioner
